# Patient Record
Sex: Male | Born: 1940 | Race: White | Hispanic: No | Marital: Married | Smoking: Never smoker
Health system: Southern US, Community
[De-identification: ages and names within clinical notes are randomized; demographics above are authoritative.]

## PROBLEM LIST (undated history)

## (undated) DIAGNOSIS — I10 Essential (primary) hypertension: Secondary | ICD-10-CM

## (undated) DIAGNOSIS — E785 Hyperlipidemia, unspecified: Secondary | ICD-10-CM

## (undated) HISTORY — PX: REPLACEMENT TOTAL KNEE: SUR1224

## (undated) HISTORY — PX: BACK SURGERY: SHX140

## (undated) HISTORY — PX: CHOLECYSTECTOMY: SHX55

## (undated) HISTORY — PX: SHOULDER SURGERY: SHX246

## (undated) HISTORY — PX: APPENDECTOMY: SHX54

---

## 2014-03-30 ENCOUNTER — Emergency Department (HOSPITAL_BASED_OUTPATIENT_CLINIC_OR_DEPARTMENT_OTHER)
Admission: EM | Admit: 2014-03-30 | Discharge: 2014-03-30 | Disposition: A | Discharge status: Home / Self Care | Payer: Medicare Other | Attending: Emergency Medicine | Admitting: Emergency Medicine

## 2014-03-30 ENCOUNTER — Ambulatory Visit (HOSPITAL_BASED_OUTPATIENT_CLINIC_OR_DEPARTMENT_OTHER)
Admission: RE | Admit: 2014-03-30 | Discharge: 2014-03-30 | Disposition: A | Discharge status: Home / Self Care | Payer: Medicare Other | Source: Ambulatory Visit | Attending: Emergency Medicine | Admitting: Emergency Medicine

## 2014-03-30 ENCOUNTER — Encounter (HOSPITAL_BASED_OUTPATIENT_CLINIC_OR_DEPARTMENT_OTHER): Payer: Self-pay | Admitting: Emergency Medicine

## 2014-03-30 ENCOUNTER — Emergency Department (HOSPITAL_BASED_OUTPATIENT_CLINIC_OR_DEPARTMENT_OTHER): Payer: Medicare Other

## 2014-03-30 ENCOUNTER — Other Ambulatory Visit (HOSPITAL_BASED_OUTPATIENT_CLINIC_OR_DEPARTMENT_OTHER): Payer: Self-pay | Admitting: *Deleted

## 2014-03-30 DIAGNOSIS — X58XXXA Exposure to other specified factors, initial encounter: Secondary | ICD-10-CM | POA: Diagnosis not present

## 2014-03-30 DIAGNOSIS — T148XXA Other injury of unspecified body region, initial encounter: Secondary | ICD-10-CM

## 2014-03-30 DIAGNOSIS — Y9389 Activity, other specified: Secondary | ICD-10-CM | POA: Insufficient documentation

## 2014-03-30 DIAGNOSIS — S023XXA Fracture of orbital floor, initial encounter for closed fracture: Secondary | ICD-10-CM

## 2014-03-30 DIAGNOSIS — S0230XA Fracture of orbital floor, unspecified side, initial encounter for closed fracture: Secondary | ICD-10-CM | POA: Insufficient documentation

## 2014-03-30 DIAGNOSIS — S46909A Unspecified injury of unspecified muscle, fascia and tendon at shoulder and upper arm level, unspecified arm, initial encounter: Secondary | ICD-10-CM | POA: Insufficient documentation

## 2014-03-30 DIAGNOSIS — J3489 Other specified disorders of nose and nasal sinuses: Secondary | ICD-10-CM | POA: Diagnosis present

## 2014-03-30 DIAGNOSIS — S62001A Unspecified fracture of navicular [scaphoid] bone of right wrist, initial encounter for closed fracture: Secondary | ICD-10-CM

## 2014-03-30 DIAGNOSIS — W010XXA Fall on same level from slipping, tripping and stumbling without subsequent striking against object, initial encounter: Secondary | ICD-10-CM | POA: Insufficient documentation

## 2014-03-30 DIAGNOSIS — S4980XA Other specified injuries of shoulder and upper arm, unspecified arm, initial encounter: Secondary | ICD-10-CM | POA: Insufficient documentation

## 2014-03-30 DIAGNOSIS — S62009A Unspecified fracture of navicular [scaphoid] bone of unspecified wrist, initial encounter for closed fracture: Secondary | ICD-10-CM | POA: Insufficient documentation

## 2014-03-30 DIAGNOSIS — I10 Essential (primary) hypertension: Secondary | ICD-10-CM | POA: Insufficient documentation

## 2014-03-30 DIAGNOSIS — S298XXA Other specified injuries of thorax, initial encounter: Secondary | ICD-10-CM | POA: Insufficient documentation

## 2014-03-30 DIAGNOSIS — Z79899 Other long term (current) drug therapy: Secondary | ICD-10-CM | POA: Insufficient documentation

## 2014-03-30 DIAGNOSIS — S02401A Maxillary fracture, unspecified, initial encounter for closed fracture: Secondary | ICD-10-CM | POA: Diagnosis present

## 2014-03-30 DIAGNOSIS — Y929 Unspecified place or not applicable: Secondary | ICD-10-CM | POA: Insufficient documentation

## 2014-03-30 DIAGNOSIS — S02400A Malar fracture unspecified, initial encounter for closed fracture: Secondary | ICD-10-CM | POA: Diagnosis present

## 2014-03-30 DIAGNOSIS — Z7982 Long term (current) use of aspirin: Secondary | ICD-10-CM | POA: Insufficient documentation

## 2014-03-30 HISTORY — DX: Essential (primary) hypertension: I10

## 2014-03-30 HISTORY — DX: Hyperlipidemia, unspecified: E78.5

## 2014-03-30 MED ORDER — HYDROCODONE-ACETAMINOPHEN 5-325 MG PO TABS: 2.0000 | ORAL_TABLET | ORAL | Status: DC | PRN

## 2014-03-30 MED ORDER — HYDROCODONE-ACETAMINOPHEN 5-325 MG PO TABS
1.0000 | ORAL_TABLET | Freq: Once | ORAL | Status: AC
Start: 2014-03-30 — End: 2014-03-30
  Administered 2014-03-30: 1 via ORAL
  Filled 2014-03-30: qty 1

## 2014-03-30 MED ORDER — CEPHALEXIN 500 MG PO CAPS: 500.0000 mg | ORAL_CAPSULE | Freq: Four times a day (QID) | ORAL | Status: DC

## 2014-03-30 NOTE — ED Notes (Signed)
Patient transported to X-ray 

## 2014-03-30 NOTE — ED Notes (Signed)
Patient here from radiology after having outpatient CT and needing further evaluation for maxillofacial fractures. Patient ambulatory, alert and oriented

## 2014-03-30 NOTE — Discharge Instructions (Signed)
Orbital Floor Fracture, Blowout Follow up with Dr. Verne SpurrLeinbach tomorrow at 1:45pm.  Take the antibiotics as prescribed. Sleep with head of bed elevated and do not blow your nose. Follow up with Dr. Gwen PoundsKowalski tomorrow as well. You also need to see Dr. Merlyn LotKuzma for your wrist fracture. Return to the ED if you develop new or worsening symptoms. The eye sits in the bony structure of the skull called the orbit. The upper and outside walls of the orbit are very thick and strong. These walls protect the eye if the head is struck from the top or side of the eye. However, the inside wall near the nose and the orbit floor are very thin and weak. The bony floor of the orbit also acts as the roof of the air-filled space (sinus) below the orbit. If the eye receives a direct blow from the front, all the tissues around the eye are briefly pressed together. This makes the orbital wall pressure very high. Since the weakest walls tend to give way first, the inside wall or the orbit floor may break. If the floor fractures, the tissues around the eye, including the muscle that is used to make the eye look down, may become trapped within the fracture as the floor of the orbit "blows out" into the sinus below.  CAUSES  Orbital floor fractures are caused by direct (blunt) trauma to the region of the eye. SYMPTOMS  Assuming that there has been no injury to the eye itself, symptoms can include:  Puffiness (swelling) and bruising around the eye area (black eye).  A gurgling sound when pressure is placed on the eye area. This sound comes from air that has escaped from the sinus into the space around the eye (orbital emphysema).  Seeing two of everything - one object being higher than the other (vertical diplopia). This is the result of the muscle that moves the eye down being trapped within the fracture. Since it cannot relax, the eye is being held in a downward position relative to the other eye and cannot look up. Vertical diplopia  from an orbital floor fracture is worse when looking up.  Pain around the eye when looking up.  One eye looks sunken compared to the other eye (enophthalmos).  Numbness of the cheek and upper gum on the same side of the face with the floor fracture. This is a result of nerve injury to these areas. This nerve runs in a groove along the bone of the orbital floor on its way to the cheek and upper gums. DIAGNOSIS  The diagnosis of an orbital floor fracture is suspected during an eye exam by an ophthalmologist. It is confirmed by X-rays or CT scan of the eye region. TREATMENT   Orbital floor fractures are not usually treated until all of the swelling around the eye has gone away. This may take 1 or 2 weeks. Once the swelling has gone down, an ophthalmologist will see if if the muscle below the eye is still trapped within the fracture.  If there is no sign of a trapped muscle or vertical diplopia, treatment is not necessary.  If there is double vision only when looking up, a decision may be made to not do anything since most people do not spend a lot of time looking up. This may depend on the person's profession. For instance, a Nutritional therapistplumber or electrician may spend a large part of their day looking up and would therefore need treatment.  If there is persistent vertical  double vision even when looking straight ahead, the ophthalmologist may try to free the muscle in the office. If this is unsuccessful, surgery is often needed. SEEK IMMEDIATE MEDICAL CARE IF:  You have had a blow to the region of your eyes and have:  A drop in vision in either eye.  Swelling and bruising around either eye.  One eye seems to be "sunken" compared to the other.  You see two of everything with both eyes open when looking in any direction.  The two images get further apart when looking in a certain direction - especially up.  You have numbness of the cheek and upper gums on the side of the injury.  You develop an  unexplained oral temperature over 102 F (38.9 C), or as your caregiver suggests. Document Released: 03/01/2001 Document Revised: 11/28/2011 Document Reviewed: 10/21/2011 [facility] Patient Information 2015 [place], [place]. This information is not intended to replace advice given to you by your health care provider. Make sure you discuss any questions you have with your health care provider.  Scaphoid Fracture, Wrist A fracture is a break in the bone. The bone you have broken often does not show up as a fracture on x-ray until later on in the healing phase. This bone is called the scaphoid bone. With this bone, your caregiver will often cast or splint your wrist as though it is fractured, even if a fracture is not seen on the x-ray. This is often done with wrist injuries in which there is tenderness at the base of the thumb. An x-ray at 1-3 weeks after your injury may confirm this fracture. A cast or splint is used to protect and keep your injured bone in good position for healing. The cast or splint will be on generally for about 6 to 16 weeks, depending on your health, age, the fracture location and how quickly you heal. Another name for the scaphoid bone is the navicular bone. HOME CARE INSTRUCTIONS   To lessen the swelling and pain, keep the injured part elevated above your heart while sitting or lying down.  Apply ice to the injury for 15-20 minutes, 03-04 times per day while awake, for 2 days. Put the ice in a plastic bag and place a thin towel between the bag of ice and your cast.  If you have a plaster or fiberglass cast or splint:  Do not try to scratch the skin under the cast using sharp or pointed objects.  Check the skin around the cast every day. You may put lotion on any red or sore areas.  Keep your cast or splint dry and clean.  If you have a plaster splint:  Wear the splint as directed.  You may loosen the elastic bandage around the splint if your fingers become numb, tingle,  or turn cold or blue.  If you have been put in a removable splint, wear and use as directed.  Do not put pressure on any part of your cast or splint; it may deform or break. Rest your cast or splint only on a pillow the first 24 hours until it is fully hardened.  Your cast or splint can be protected during bathing with a plastic bag. Do not lower the cast or splint into water.  Only take over-the-counter or prescription medicines for pain, discomfort, or fever as directed by your caregiver.  If your caregiver has given you a follow up appointment, it is very important to keep that appointment. Not keeping the appointment could  result in chronic pain and decreased function. If there is any problem keeping the appointment, you must call back to this facility for assistance. SEEK IMMEDIATE MEDICAL CARE IF:   Your cast gets damaged, wet or breaks.  You have continued severe pain or more swelling than you did before the cast or splint was put on.  Your skin or nails below the injury turn blue or gray, or feel cold or numb.  You have tingling or burning pain in your fingers or increasing pain with movement of your fingers Document Released: 08/26/2002 Document Revised: 11/28/2011 Document Reviewed: 04/24/2009 [facility] Patient Information 2015 [place], [place]. This information is not intended to replace advice given to you by your health care provider. Make sure you discuss any questions you have with your health care provider.

## 2014-03-30 NOTE — ED Notes (Signed)
Pt reports fall on Friday - states he was ambulating up an incline and slipped and tripped over his feet. Pt sent by Urgent Care for facial CT and based on Radiologist recommendation patient should be admitted for orbital fractures. Pt reports he fell onto face and right side of body. Pt c/o right arm pain, tenderness, edema - pt has splint in place that was placed by Urgent Care. Discoloration to right knee. Pt has facial discoloration with pain noted.

## 2014-03-30 NOTE — ED Notes (Signed)
I applied a thumb spica splint with fiberglass. I first applied sock material, then cotton webril, then fiberglass and held all with soft kerlix wrap. I completed with ace and secured with tape. Patient stated he felt much better, comfort improved.

## 2014-03-30 NOTE — ED Provider Notes (Signed)
CSN: [id number]     Arrival date & time 03/30/14  1851 History  This chart was scribed for Glynn Octave, MD by Modena Jansky, ED Scribe. This patient was seen in room MH04/MH04 and the patient's care was started at 6:59 PM.   Chief Complaint  Patient presents with  . Fall   HPI HPI Comments: Randale Carvalho is a 73 y.o. male who presents to the Emergency Department complaining of a fall that occurred two nights ago. He states that he tripped over his own feet and fell against concrete. He states his face, right wrist, rib, and right shoulder hurts. He states that he went to [facility] and they did x rays but was advised to come to the ED after CT scan showed orbital fracture. He denies any LOC, dizziness, chest pain, cough, headache, or visual disturbance. Tetanus up to date  PCP- Dr. Evonnie Dawes in [place] Past Medical History  Diagnosis Date  . Hypertension   . Hyperlipidemia    Past Surgical History  Procedure Laterality Date  . Shoulder surgery    . Back surgery    . Replacement total knee Bilateral   . Appendectomy    . Cholecystectomy     History reviewed. No pertinent family history. History  Substance Use Topics  . Smoking status: Never Smoker   . Smokeless tobacco: Not on file  . Alcohol Use: No    Review of Systems  A complete 10 system review of systems was obtained and all systems are negative except as noted in the HPI and PMH.   Allergies  Review of patient's allergies indicates no known allergies.  Home Medications   Prior to Admission medications   Medication Sig Start Date End Date Taking? Authorizing Provider  AMLODIPINE BESYLATE PO Take by mouth 2 (two) times daily.   Yes Historical Provider, MD  aspirin 81 MG tablet Take 81 mg by mouth daily.   Yes Historical Provider, MD  dextromethorphan-guaiFENesin (ROBITUSSIN-DM) 10-100 MG/5ML liquid Take by mouth every 4 (four) hours as needed for cough.   Yes Historical Provider, MD  FENOFIBRATE MICRONIZED PO  Take by mouth.   Yes Historical Provider, MD  LISINOPRIL PO Take by mouth.   Yes Historical Provider, MD  Multiple Vitamin (MULTIVITAMIN) capsule Take 1 capsule by mouth daily.   Yes Historical Provider, MD  pantoprazole (PROTONIX) 40 MG tablet Take 40 mg by mouth at bedtime.   Yes Historical Provider, MD  PAROXETINE HCL PO Take by mouth.   Yes Historical Provider, MD  TERAZOSIN HCL PO Take by mouth.   Yes Historical Provider, MD  cephALEXin (KEFLEX) 500 MG capsule Take 1 capsule (500 mg total) by mouth 4 (four) times daily. 03/30/14   Glynn Octave, MD  HYDROcodone-acetaminophen (NORCO/VICODIN) 5-325 MG per tablet Take 2 tablets by mouth every 4 (four) hours as needed. 03/30/14   Glynn Octave, MD   BP 172/86  Pulse 78  Temp(Src) 98.1 F (36.7 C) (Oral)  Resp 18  Ht 5\' 9"  (1.753 m)  Wt 284 lb (128.822 kg)  BMI 41.92 kg/m2  SpO2 94% Physical Exam  Nursing note and vitals reviewed. Constitutional: He is oriented to person, place, and time. He appears well-developed and well-nourished. No distress.  HENT:  Head: Normocephalic and atraumatic.  Mouth/Throat: Oropharynx is clear and moist. No oropharyngeal exudate.  Subconjunctival hemorrhage on right eye with purulent discharge in conjunctiva. R maxillary sinus tenderness Teeth are normal with no malocclusion.   Eyes: EOM are normal. Pupils are  equal, round, and reactive to light. Left eye exhibits discharge. Left eye exhibits no chemosis. Left conjunctiva is injected. Left conjunctiva has a hemorrhage.  Slit lamp exam:      The left eye shows no hyphema and no hypopyon.  Periorbital ecchymosis on R. EOMI  And full  Neck: Normal range of motion. Neck supple.  No C spine tenderness  Cardiovascular: Normal rate, regular rhythm, normal heart sounds and intact distal pulses.   No murmur heard. Pulmonary/Chest: Effort normal and breath sounds normal. No respiratory distress.  Abdominal: Soft. There is no tenderness. There is no rebound  and no guarding.  Musculoskeletal: Normal range of motion. He exhibits tenderness. He exhibits no edema.  No c-spine tenderness. Periorbital ecchymosis on R.  Tenderness to right wrist no crepitance. +snuffbox tenderness. +2 radial pulse. Pain with normal range of motion. Cardinal movements intact.   FROM R elbow and shoulder Abrasion R knee  Neurological: He is alert and oriented to person, place, and time. No cranial nerve deficit. He exhibits normal muscle tone. Coordination normal.  No ataxia on finger to nose bilaterally. No pronator drift. 5/5 strength throughout. CN 2-12 intact. Negative Romberg. Equal grip strength. Sensation intact. Gait is normal.   Skin: Skin is warm. There is erythema.  Psychiatric: He has a normal mood and affect. His behavior is normal.    ED Course  Procedures (including critical care time) DIAGNOSTIC STUDIES: Oxygen Saturation is 94% on RA, normal by my interpretation.    COORDINATION OF CARE: 7:03 PM- Pt advised of plan for treatment which includes radiology and pt agrees.  Labs Review Labs Reviewed - No data to display  Imaging Review Dg Wrist Complete Right  03/30/2014   CLINICAL DATA:  Pain post trauma  EXAM: RIGHT WRIST - COMPLETE 3+ VIEW  COMPARISON:  None.  FINDINGS: Frontal, oblique, lateral, and ulnar deviation scaphoid images were obtained. There is a subtle lucency in the distal scaphoid concerning for of fracture in this area. No other fracture. No dislocation. 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 Calcifications are noted within the radiocarpal joint.  IMPRESSION: Findings concerning for nondisplaced fracture of the distal scaphoid, best appreciated on the frontal view. Multifocal osteoarthritic change. No dislocation.   Electronically Signed   By: Bretta Bang M.D.   On: 03/30/2014 19:41   Ct Head Wo Contrast  03/30/2014   CLINICAL DATA:  Status post fall with a blow to the face.  EXAM: CT HEAD WITHOUT CONTRAST  TECHNIQUE:  Contiguous axial images were obtained from the base of the skull through the vertex without intravenous contrast.  COMPARISON:  Maxillofacial CT scan earlier this same day.  FINDINGS: There is some cortical atrophy and chronic microvascular ischemic change. No evidence of acute abnormality including infarct, hemorrhage, mass lesion, mass effect, midline shift or abnormal extra-axial fluid collection is identified. No pneumocephalus or hydrocephalus is present. The calvarium is intact. Fracture of the inferior wall the right orbit is again seen as on the study earlier today.  IMPRESSION: No acute finding.  Fracture inferior wall right orbit as seen on maxillofacial CT scan earlier today.   Electronically Signed   By: Drusilla Kanner M.D.   On: 03/30/2014 19:42   Ct Maxillofacial Wo Cm  03/30/2014   CLINICAL DATA:  Pain post trauma  EXAM: CT MAXILLOFACIAL WITHOUT CONTRAST  TECHNIQUE: Multidetector CT imaging of the maxillofacial structures was performed. Multiplanar CT image reconstructions were also generated. A small metallic BB was placed on the right temple  in order to reliably differentiate right from left.  COMPARISON:  None.  FINDINGS: There is a fracture of the right orbital floor with fat extending into the superior aspect of the right maxillary antrum. There appears to be entrapment of the inferior oblique muscle in this area. There is a fracture of the right maxillary antrum with mild medial bowing in this area. There is soft tissue swelling over the mid right face.  No other fractures are appreciable. No dislocation. There is a retention cyst in the inferomedial left maxillary antrum. There is mucosal thickening in the right maxillary antrum. There is mucosal thickening in several ethmoid sinuses bilaterally. There is opacification of a posterior ethmoid air cell. Other paranasal sinuses are clear.  There is mild leftward deviation of the anterior fibrous nasal septum.  Ostiomeatal unit complexes  appear patent bilaterally.  Orbits appear symmetric and normal bilaterally except for the apparent entrapment of the right inferior oblique muscle. Visualized brain parenchyma appears normal except for mild generalized atrophy. Mastoid air cells clear. Visualized upper neck structures appear unremarkable.  IMPRESSION: Fracture of the right orbital floor with fat extending into the superior right maxillary antrum. There appears to be some entrapment of the right inferior oblique muscle. There is also a fracture of the right maxillary antrum.  No other fractures are appreciable. There are areas of paranasal sinus disease. Study otherwise unremarkable except for mild generalized cerebral atrophy.  These results were called by telephone at the time of interpretation on 03/30/2014 at 6:46 PM to Dr. Laurey Arrow who verbally acknowledged these results. Dr. Manus Gunning is the covering physician at the [facility] facility; he agreed to see the patient for the apparent right inferior oblique ocular entrapment and make appropriate specialty referral as needed.   Electronically Signed   By: Bretta Bang M.D.   On: 03/30/2014 18:47     EKG Interpretation None      MDM   Final diagnoses:  Fracture of orbital floor, closed, initial encounter  Scaphoid fracture of wrist, right, closed, initial encounter   Patient had outpatient CT scan which showed right orbital floor fracture with concern for entrapment of right inferior oblique muscle. He had a mechanical fall 2 nights ago and was seen at urgent care. Denies losing consciousness. Complains of right shoulder, right wrist and right rib pain. Denies any shortness of breath. Plain films at urgent care were negative. EOMI intact. No blurry vision or double vision. Visual acuity OD 20/50, OS 20/30.  X-ray from urgent care shows no rib fracture or pneumothorax. Right wrist shows soft tissue swelling. right forearm was negative, right shoulder  was negative.  Case discussed with [place] [facility] physician Vira Blanco.  he agrees patient does not appear to be clinically entrapped.   he agrees with sinus precautions, antibiotics. He will see patient in the office at 1:45 PM tomorrow.  Visual acuity is normal. D/w Dr. Gwen Pounds, ophthomology.  He agrees clinically patient is not entrapped and no indication for emergent evaluation tonight.  He wishes to see in office this week. He agrees with antibiotics and sinus precautions.   X-ray shows probable scaphoid fracture. Patient we placed in spica splint and will be given hand followup as well.  BP 171/80  Pulse 69  Temp(Src) 98.7 F (37.1 C) (Oral)  Resp 18  Ht 5\' 9"  (1.753 m)  Wt 284 lb (128.822 kg)  BMI 41.92 kg/m2  SpO2 92%  I personally performed the services described  in this documentation, which was scribed in my presence. The recorded information has been reviewed and is accurate.     Glynn OctaveStephen Jakin Pavao, MD 03/31/14 [id number]

## 2014-12-15 IMAGING — CR DG WRIST COMPLETE 3+V*R*
4 series · 4 of 4 positions shown · non-contrast
Comparison: None.

CLINICAL DATA: Pain post trauma

EXAM:
RIGHT WRIST - COMPLETE 3+ VIEW

[x wrist pa right]
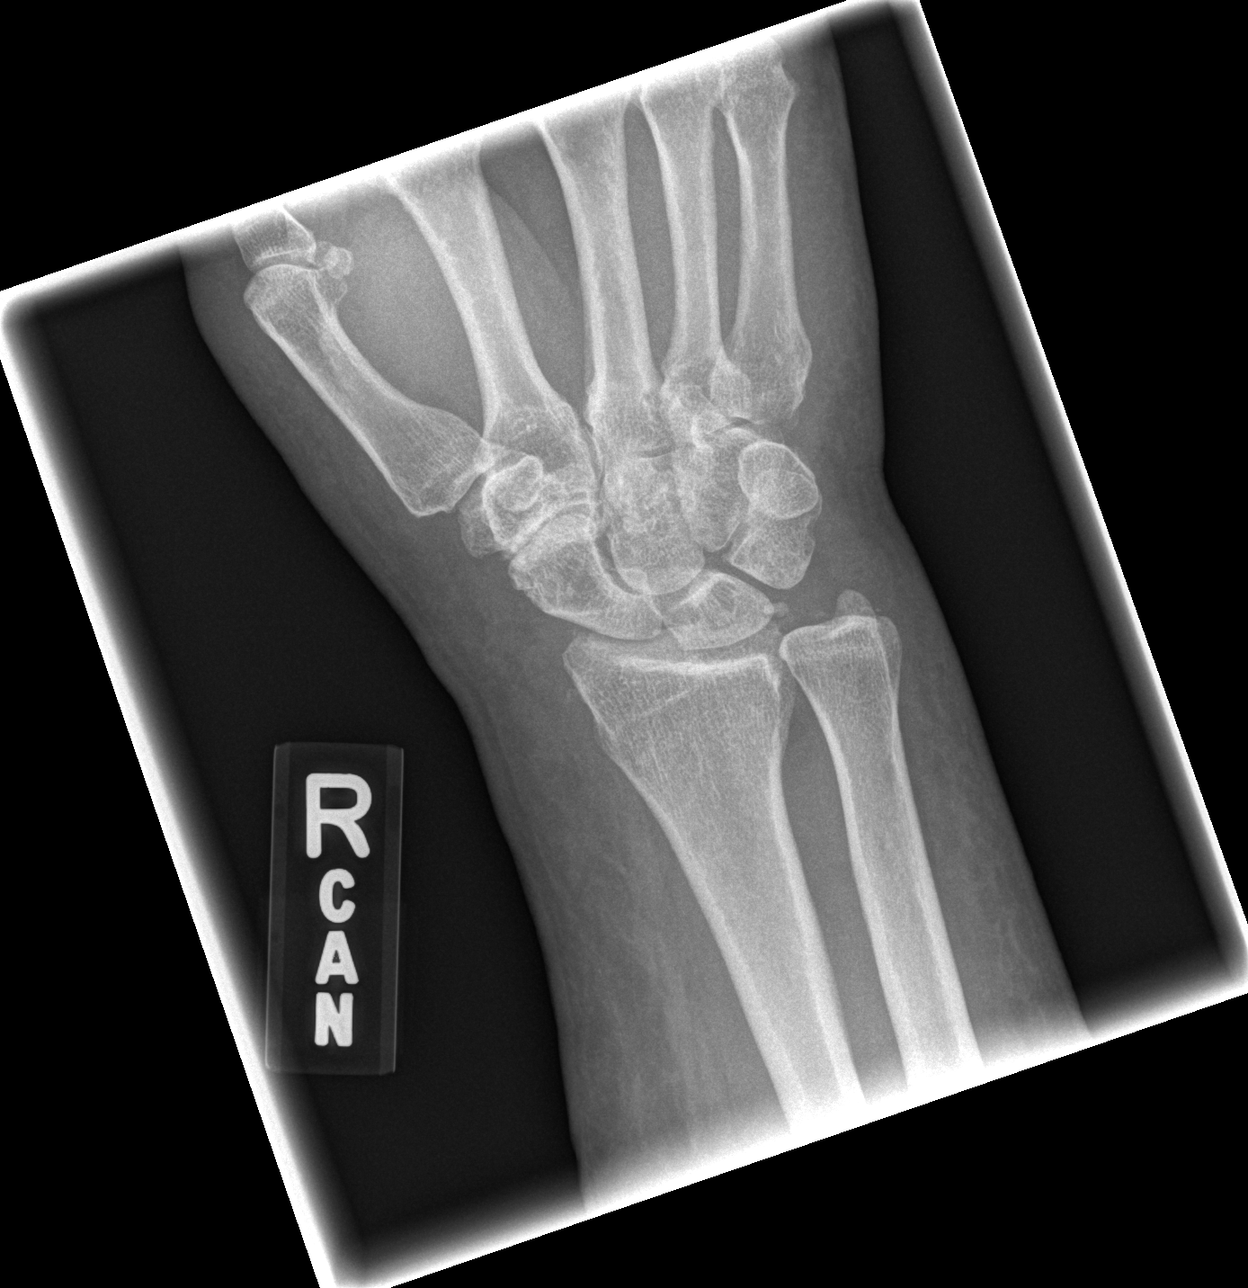

[x wrist obl right]
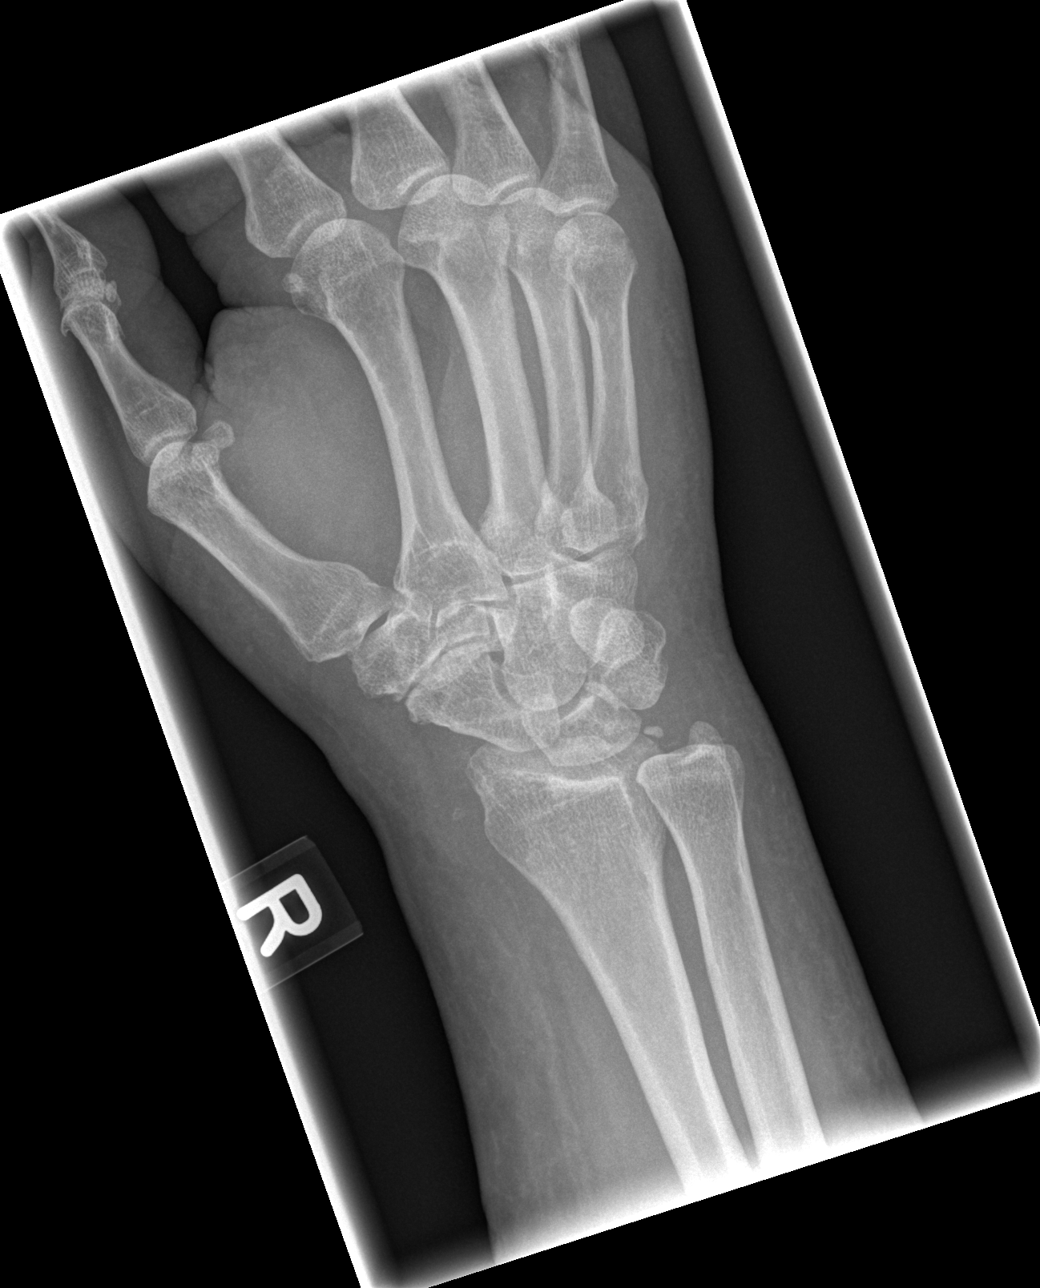

[x wrist lat right]
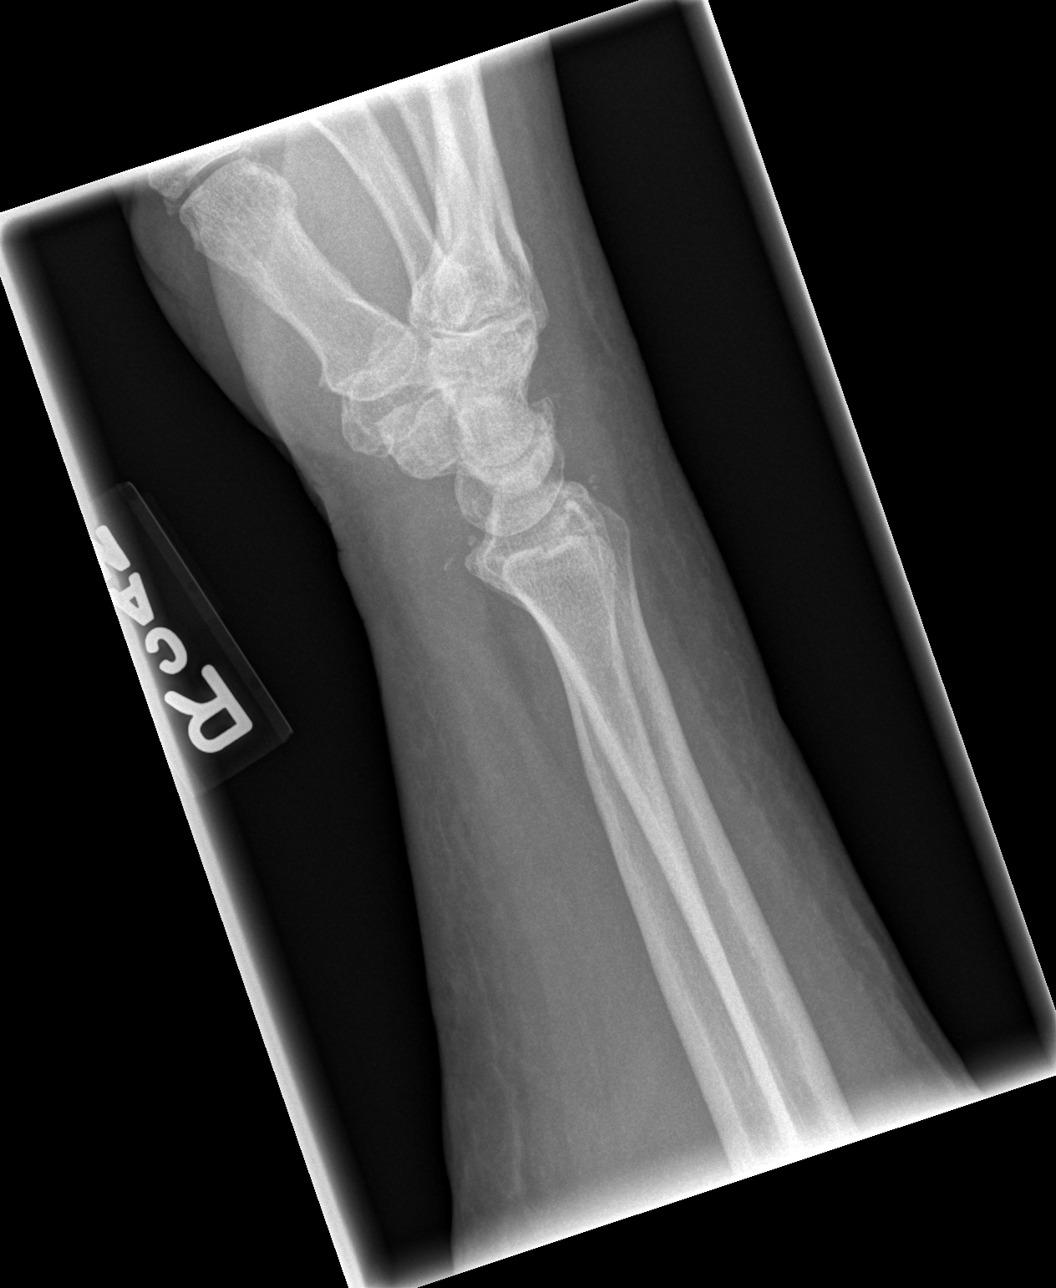

[x navicular]
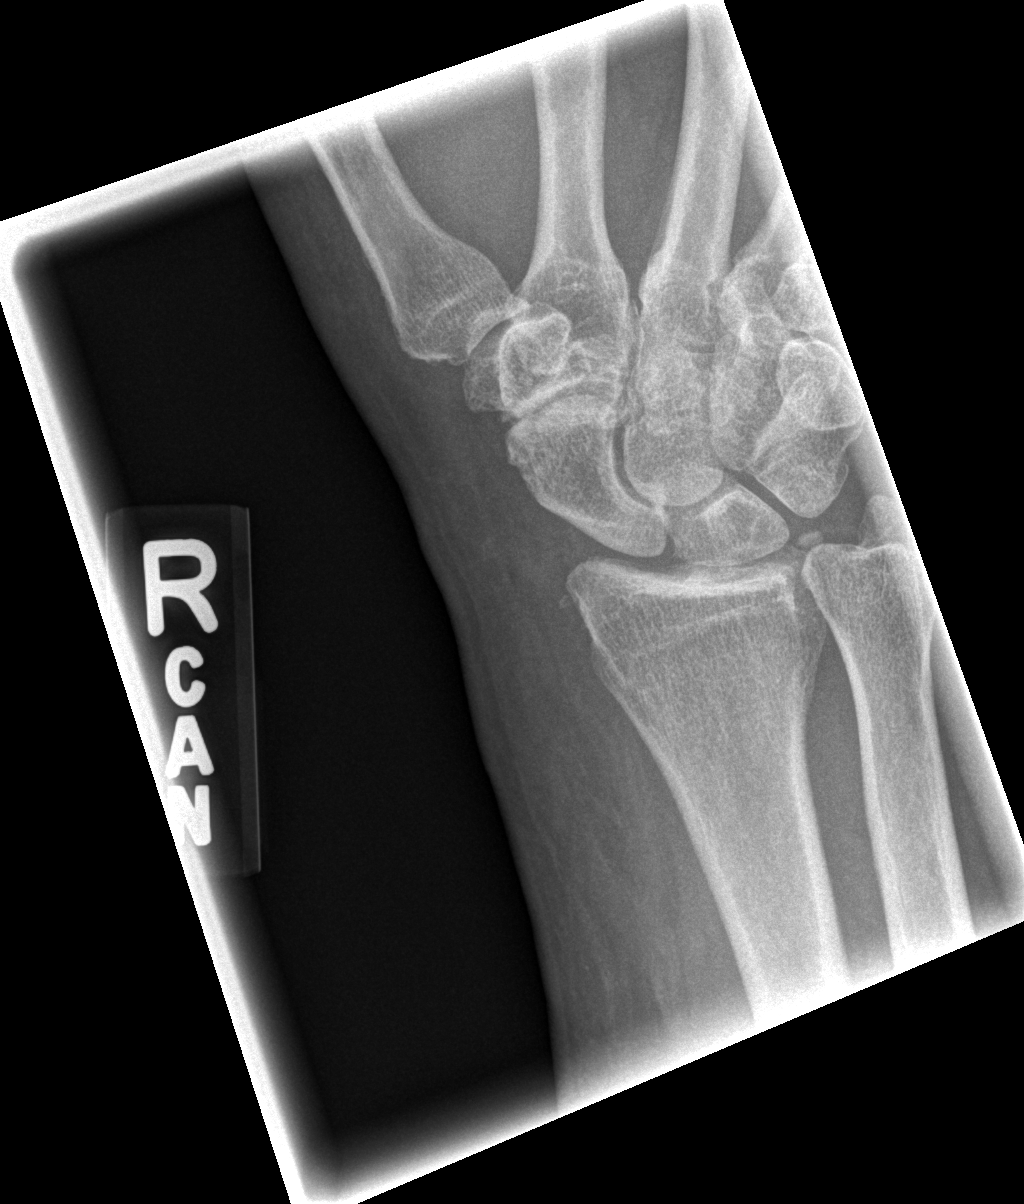

[4 of 4 positions shown; findings below may reference images not displayed]

FINDINGS: Frontal, oblique, lateral, and ulnar deviation scaphoid images were
obtained. There is a subtle lucency in the distal scaphoid
concerning for of fracture in this area. No other fracture. No
dislocation. There is moderate generalized osteoarthritic change.
Calcifications are noted within the radiocarpal joint.
IMPRESSION: Findings concerning for nondisplaced fracture of the distal
scaphoid, best appreciated on the frontal view. Multifocal
osteoarthritic change. No dislocation.

## 2015-10-08 DIAGNOSIS — N400 Enlarged prostate without lower urinary tract symptoms: Secondary | ICD-10-CM | POA: Insufficient documentation

## 2015-10-08 DIAGNOSIS — I10 Essential (primary) hypertension: Secondary | ICD-10-CM | POA: Insufficient documentation

## 2015-11-12 DIAGNOSIS — K219 Gastro-esophageal reflux disease without esophagitis: Secondary | ICD-10-CM | POA: Insufficient documentation

## 2015-11-13 DIAGNOSIS — G4733 Obstructive sleep apnea (adult) (pediatric): Secondary | ICD-10-CM | POA: Insufficient documentation

## 2016-03-21 DIAGNOSIS — Z8679 Personal history of other diseases of the circulatory system: Secondary | ICD-10-CM | POA: Insufficient documentation

## 2017-02-21 DIAGNOSIS — H16223 Keratoconjunctivitis sicca, not specified as Sjogren's, bilateral: Secondary | ICD-10-CM | POA: Insufficient documentation

## 2017-02-21 DIAGNOSIS — H353131 Nonexudative age-related macular degeneration, bilateral, early dry stage: Secondary | ICD-10-CM | POA: Insufficient documentation

## 2017-04-17 DIAGNOSIS — G250 Essential tremor: Secondary | ICD-10-CM | POA: Insufficient documentation

## 2017-11-17 DIAGNOSIS — E291 Testicular hypofunction: Secondary | ICD-10-CM | POA: Insufficient documentation

## 2019-03-29 DIAGNOSIS — J984 Other disorders of lung: Secondary | ICD-10-CM | POA: Insufficient documentation

## 2019-10-21 DIAGNOSIS — E559 Vitamin D deficiency, unspecified: Secondary | ICD-10-CM | POA: Insufficient documentation

## 2019-11-06 DIAGNOSIS — E1122 Type 2 diabetes mellitus with diabetic chronic kidney disease: Secondary | ICD-10-CM | POA: Insufficient documentation

## 2019-11-06 DIAGNOSIS — I272 Pulmonary hypertension, unspecified: Secondary | ICD-10-CM | POA: Insufficient documentation

## 2021-07-31 ENCOUNTER — Other Ambulatory Visit: Payer: Self-pay | Admitting: Internal Medicine

## 2021-09-07 ENCOUNTER — Other Ambulatory Visit: Payer: Self-pay | Admitting: Internal Medicine

## 2021-12-10 DIAGNOSIS — M150 Primary generalized (osteo)arthritis: Secondary | ICD-10-CM | POA: Insufficient documentation

## 2022-04-21 DIAGNOSIS — F5101 Primary insomnia: Secondary | ICD-10-CM | POA: Insufficient documentation

## 2022-09-08 DIAGNOSIS — I4891 Unspecified atrial fibrillation: Secondary | ICD-10-CM | POA: Insufficient documentation

## 2022-09-08 DIAGNOSIS — N183 Chronic kidney disease, stage 3 unspecified: Secondary | ICD-10-CM | POA: Insufficient documentation

## 2022-09-08 DIAGNOSIS — I480 Paroxysmal atrial fibrillation: Secondary | ICD-10-CM | POA: Insufficient documentation

## 2022-09-15 DIAGNOSIS — D500 Iron deficiency anemia secondary to blood loss (chronic): Secondary | ICD-10-CM | POA: Insufficient documentation

## 2022-11-29 DIAGNOSIS — I5030 Unspecified diastolic (congestive) heart failure: Secondary | ICD-10-CM | POA: Insufficient documentation

## 2023-12-25 DIAGNOSIS — Z7409 Other reduced mobility: Secondary | ICD-10-CM | POA: Insufficient documentation

## 2024-06-27 DIAGNOSIS — I350 Nonrheumatic aortic (valve) stenosis: Secondary | ICD-10-CM | POA: Insufficient documentation

## 2024-07-30 ENCOUNTER — Ambulatory Visit: Admitting: Family Medicine

## 2024-07-30 ENCOUNTER — Encounter: Payer: Self-pay | Admitting: Family Medicine

## 2024-07-30 VITALS — BP 178/102 | HR 82 | Temp 97.0°F | Resp 18 | Ht 70.5 in | Wt 273.0 lb

## 2024-07-30 DIAGNOSIS — F5101 Primary insomnia: Secondary | ICD-10-CM

## 2024-07-30 DIAGNOSIS — Z7689 Persons encountering health services in other specified circumstances: Secondary | ICD-10-CM

## 2024-07-30 DIAGNOSIS — R052 Subacute cough: Secondary | ICD-10-CM | POA: Diagnosis not present

## 2024-07-30 DIAGNOSIS — G4733 Obstructive sleep apnea (adult) (pediatric): Secondary | ICD-10-CM

## 2024-07-30 DIAGNOSIS — I5032 Chronic diastolic (congestive) heart failure: Secondary | ICD-10-CM

## 2024-07-30 DIAGNOSIS — E66812 Obesity, class 2: Secondary | ICD-10-CM

## 2024-07-30 DIAGNOSIS — I480 Paroxysmal atrial fibrillation: Secondary | ICD-10-CM | POA: Diagnosis not present

## 2024-07-30 DIAGNOSIS — N400 Enlarged prostate without lower urinary tract symptoms: Secondary | ICD-10-CM

## 2024-07-30 DIAGNOSIS — E1122 Type 2 diabetes mellitus with diabetic chronic kidney disease: Secondary | ICD-10-CM | POA: Diagnosis not present

## 2024-07-30 DIAGNOSIS — I10 Essential (primary) hypertension: Secondary | ICD-10-CM

## 2024-07-30 DIAGNOSIS — Z6838 Body mass index (BMI) 38.0-38.9, adult: Secondary | ICD-10-CM

## 2024-07-30 DIAGNOSIS — G250 Essential tremor: Secondary | ICD-10-CM

## 2024-07-30 DIAGNOSIS — N1831 Chronic kidney disease, stage 3a: Secondary | ICD-10-CM

## 2024-07-30 DIAGNOSIS — Z7409 Other reduced mobility: Secondary | ICD-10-CM

## 2024-07-30 DIAGNOSIS — E782 Mixed hyperlipidemia: Secondary | ICD-10-CM

## 2024-07-30 MED ORDER — DM-GUAIFENESIN ER 30-600 MG PO TB12: 1.0000 | ORAL_TABLET | Freq: Two times a day (BID) | ORAL | Status: DC

## 2024-07-30 NOTE — Progress Notes (Signed)
 Assessment  Assessment/Plan:  Assessment and Plan Assessment & Plan Atrial fibrillation Managed by cardiology. Watchman device in place, no current anticoagulation. Scheduled for cardioversion on November 17th, 2025. Previous cardioversions were unsuccessful. - Proceed with scheduled cardioversion on November 17th, 2025.  Heart failure with preserved ejection fraction Managed with blood pressure control and Bumex 1 mg three times a week. Cardiologist involved in management. - Continue current heart failure management regimen.  Hypertension Consistently elevated blood pressure despite compliance with amlodipine, Cardizem, losartan, and hydralazine. Blood pressure was 200/100 mmHg during recent travel. - Continue current antihypertensive regimen.  Chronic kidney disease Noted with some kidney changes over time.  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 Engages in regular physical activity but struggles with weight loss. Discussed potential use of weight loss medications, considering cost and insurance implications. Previous hemoglobin A1c indicated diabetic range, which may aid in medication cost reduction. - Continue physical activity and healthy lifestyle modifications. - Will consider weight loss medications post-cardioversion, considering cost and insurance implications.  Obstructive sleep apnea Managed with CPAP, but compliance is intermittent.  Insomnia Managed with trazodone, which is effective. - Continue trazodone for insomnia management.  Hyperlipidemia Reports balance issues potentially related to Lipitor use. - Continue Lipitor for hyperlipidemia management.  Chronic mucus production Difficulty expectorating. No prior use of Mucinex. - Recommended over-the-counter Mucinex (guaifenesin) for mucus thinning.  General Health Maintenance Thyroid function previously checked and normal. No current thyroid issues reported. - Continue routine health  maintenance.      Medications Discontinued During This Encounter  Medication Reason   AMLODIPINE BESYLATE PO    dextromethorphan-guaiFENesin (ROBITUSSIN-DM) 10-100 MG/5ML liquid    FENOFIBRATE MICRONIZED PO    LISINOPRIL PO    HYDROcodone -acetaminophen  (NORCO/VICODIN) 5-325 MG per tablet    cephALEXin  (KEFLEX ) 500 MG capsule    PAROXETINE HCL PO    TERAZOSIN HCL PO     Patient Counseling(The following topics were reviewed and/or handout was given):  -Nutrition: Stressed importance of moderation in sodium/caffeine intake, saturated fat and cholesterol, caloric balance, sufficient intake of fresh fruits, vegetables, and fiber.  -Stressed the importance of regular exercise.   -Substance Abuse: Discussed cessation/primary prevention of tobacco, alcohol, or other drug use; driving or other dangerous activities under the influence; availability of treatment for abuse.   -Injury prevention: Discussed safety belts, safety helmets, smoke detector, smoking near bedding or upholstery.   -Sexuality: Discussed sexually transmitted diseases, partner selection, use of condoms, avoidance of unintended pregnancy and contraceptive alternatives.   -Dental health: Discussed importance of regular tooth brushing, flossing, and dental visits.  -Health maintenance and immunizations reviewed. Please refer to Health maintenance section.  Return in about 6 weeks (around 09/10/2024) for weight management, Blood pressure, blood sugar.        Subjective:   Encounter date: 07/30/2024  Chief Complaint  Patient presents with   Establish Care    Pt is not fasting today   HM due- vaccinations    Hypertension    Pt states he does monitor blood pressure at home but is unsure of numbers    Discussed the use of AI scribe software for clinical note transcription with the patient, who gave verbal consent to proceed.  History of Present Illness Gabriel Higgins is an 83 year old male with atrial fibrillation  and heart failure who presents to establish care.  Atrial fibrillation and cardiac rhythm management - Atrial fibrillation with five previous unsuccessful cardioversions - Scheduled for repeat cardioversion on August 05, 2024 -  Followed by cardiology, last seen earlier this week - Presence of Watchman device - Not taking anticoagulation therapy  Heart failure with preserved ejection fraction - Heart failure with preserved ejection fraction - Bumex 1 mg taken three times a week - No current symptoms of decompensation reported  Hypertension - Consistently elevated blood pressure - Adherent to amlodipine, Cardizem, losartan, and hydralazine  Obesity and weight management - Class II obesity, BMI 38.6 - Weight loss from 310 pounds to 273 pounds - Engaged in physical exercise, including biking and leg exercises - Actively attempting further weight reduction  Obstructive sleep apnea - Obstructive sleep apnea managed with CPAP - Intermittent compliance with CPAP therapy  Insomnia - Insomnia managed with trazodone - Trazodone effective in improving sleep  Balance disturbance - Balance difficulty attributed to Lipitor, started several months ago - Uses a walker for ambulation - Actively working to improve balance and reduce walker dependence  Respiratory symptoms - Difficulty expectorating phlegm - Occasional production of 'brown junk' when brushing teeth or using mouthwash      07/30/2024    2:23 PM  Depression screen PHQ 2/9  Decreased Interest 0  Down, Depressed, Hopeless 0  PHQ - 2 Score 0  Altered sleeping 0  Tired, decreased energy 0  Change in appetite 0  Feeling bad or failure about yourself  0  Trouble concentrating 0  Moving slowly or fidgety/restless 0  Suicidal thoughts 0  PHQ-9 Score 0  Difficult doing work/chores Not difficult at all       07/30/2024    2:24 PM  GAD 7 : Generalized Anxiety Score  Nervous, Anxious, on Edge 0  Control/stop worrying  0  Worry too much - different things 0  Trouble relaxing 0  Restless 0  Easily annoyed or irritable 0  Afraid - awful might happen 0  Total GAD 7 Score 0  Anxiety Difficulty Not difficult at all    Health Maintenance Due  Topic Date Due   HEMOGLOBIN A1C  Never done   FOOT EXAM  Never done   Diabetic kidney evaluation - eGFR measurement  Never done   Diabetic kidney evaluation - Urine ACR  Never done   Medicare Annual Wellness (AWV)  12/14/2022      PMH:  The following were reviewed and entered/updated in epic: Past Medical History:  Diagnosis Date   Hyperlipidemia    Hypertension     Patient Active Problem List   Diagnosis Date Noted   Class 2 severe obesity due to excess calories with serious comorbidity and body mass index (BMI) of 38.0 to 38.9 in adult 07/30/2024   Subacute cough 07/30/2024   Mixed hyperlipidemia 07/30/2024   Aortic valve stenosis 06/27/2024   Impaired functional mobility, balance, gait, and endurance 12/25/2023   (HFpEF) heart failure with preserved ejection fraction (HCC) 11/29/2022   Iron deficiency anemia due to chronic blood loss 09/15/2022   Atrial fibrillation (HCC) 09/08/2022   Chronic kidney disease (CKD), stage III (moderate) (HCC) 09/08/2022   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   Diabetes mellitus with stage 3 chronic kidney disease, without long-term current use of insulin (HCC) 11/06/2019   Pulmonary hypertension (HCC) 11/06/2019   Hyperphosphatemia 10/21/2019   Vitamin D deficiency 10/21/2019   Restrictive lung disease 03/29/2019   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   Benign essential tremor 04/17/2017   Early stage nonexudative age-related macular degeneration of both eyes 02/21/2017   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 No pertinent family history.  Medications- reviewed and updated Outpatient Medications Prior to Visit  Medication Sig Dispense Refill   amiodarone (PACERONE) 200 MG tablet Take 200 mg by mouth daily.     aspirin 81 MG tablet Take 81 mg by mouth daily.     bumetanide (BUMEX) 1 MG tablet Take 1 mg by mouth.     cyanocobalamin 100 MCG tablet Take 100 mcg by mouth.     diltiazem (CARDIZEM CD) 180 MG 24 hr capsule Take 180 mg by mouth.     hydrALAZINE (APRESOLINE) 100 MG tablet Take 100 mg by mouth 3 (three) times daily.     losartan (COZAAR) 100 MG tablet Take 100 mg by mouth daily.     Multiple Vitamin (MULTIVITAMIN) capsule Take 1 capsule by mouth daily.     propranolol (INDERAL) 10 MG tablet TAKE 2 TABLETS (20 MG TOTAL) BY MOUTH 3 TIMES DAILY.     traZODone (DESYREL) 50 MG tablet Take 50 mg by mouth at bedtime.     pantoprazole (PROTONIX) 40 MG tablet Take 40 mg by mouth at bedtime. (Patient not taking: Reported on 07/30/2024)     AMLODIPINE BESYLATE PO Take by mouth 2 (two) times daily. (Patient not taking: Reported on 07/30/2024)     cephALEXin  (KEFLEX ) 500 MG capsule Take 1 capsule (500 mg total) by mouth 4 (four) times daily. (Patient not taking: Reported on 07/30/2024) 40 capsule 0   dextromethorphan-guaiFENesin (ROBITUSSIN-DM) 10-100 MG/5ML liquid Take by mouth every 4 (four) hours as needed for cough. (Patient not taking: Reported on 07/30/2024)     FENOFIBRATE MICRONIZED PO Take by mouth. (Patient not taking: Reported on 07/30/2024)     HYDROcodone -acetaminophen  (NORCO/VICODIN) 5-325 MG per tablet Take 2 tablets by mouth every 4 (four) hours as needed. (Patient not taking:  Reported on 07/30/2024) 10 tablet 0   LISINOPRIL PO Take by mouth. (Patient not taking: Reported on 07/30/2024)     PAROXETINE HCL PO Take by mouth. (Patient not taking: Reported on 07/30/2024)     TERAZOSIN HCL PO Take by mouth. (Patient not taking: Reported on 07/30/2024)     No facility-administered medications prior to visit.     Allergies  Allergen Reactions   Apixaban Other (See Comments)    Increased bleeding  Rectum bleeding  Rectum bleeding    Increased bleeding   Bee Venom Anaphylaxis   Honey Bee Venom Anaphylaxis   Mirabegron Anaphylaxis, Hypertension, Other (See Comments), Palpitations and Photosensitivity    Atrial Fibrillation  Hypertension  Hypertension  Atrial Fibrillation  Other Reaction(s): Hypertension    Hypertension Atrial Fibrillation    Atrial Fibrillation   Lisinopril Cough   Atorvastatin Other (See Comments)    Social History   Socioeconomic History   Marital status: Married    Spouse name: Not on file   Number of children: Not on file   Years of education: Not on file   Highest education level: Not on file  Occupational History   Not on file  Tobacco Use   Smoking  status: Never   Smokeless tobacco: Not on file  Vaping Use   Vaping status: Never Used  Substance and Sexual Activity   Alcohol use: No   Drug use: No   Sexual activity: Not Currently    Birth control/protection: None  Other Topics Concern   Not on file  Social History Narrative   Not on file   Social Drivers of Health   Financial Resource Strain: Not on file  Food Insecurity: Low Risk  (07/26/2024)   Received from [facility]   Hunger Vital Sign    Within the past 12 months, you worried that your food would run out before you got money to buy more: Never true    Within the past 12 months, the food you bought just didn't last and you didn't have money to get more. : Never true  Transportation Needs: No Transportation Needs (02/19/2023)   Received from [employer]    In the past 12 months, has lack of reliable transportation kept you from medical appointments, meetings, work or from getting things needed for daily living? : No  Physical Activity: Not on file  Stress: Not on file  Social Connections: Unknown (02/01/2022)   Received from [facility]   Social Network    Social Network: Not on file           Objective:  Physical Exam: BP (!) 178/102 (BP Location: Right Arm, Patient Position: Sitting, Cuff Size: Large) Comment: recheck  Pulse 82   Temp (!) 97 F (36.1 C) (Temporal)   Resp 18   Ht 5' 10.5 (1.791 m)   Wt 273 lb (123.8 kg)   SpO2 98%   BMI 38.62 kg/m   Body mass index is 38.62 kg/m. Wt Readings from Last 3 Encounters:  07/30/24 273 lb (123.8 kg)  03/30/14 284 lb (128.8 kg)    Physical Exam          Physical Exam Constitutional:      Appearance: Normal appearance.  HENT:     Head: Normocephalic and atraumatic.     Right Ear: Hearing normal.     Left Ear: Hearing normal.     Nose: Nose normal.  Eyes:     General: No scleral icterus.       Right eye: No discharge.        Left eye: No discharge.     Extraocular Movements: Extraocular movements intact.  Cardiovascular:     Rate and Rhythm: Normal rate and regular rhythm.     Heart sounds: Normal heart sounds.  Pulmonary:     Effort: Pulmonary effort is normal.     Breath sounds: Normal breath sounds.  Abdominal:     Palpations: Abdomen is soft.     Tenderness: There is no abdominal tenderness.  Skin:    General: Skin is warm.     Findings: No rash.  Neurological:     General: No focal deficit present.     Mental Status: He is alert.     Cranial Nerves: No cranial nerve deficit.  Psychiatric:        Mood and Affect: Mood normal.        Behavior: Behavior normal.        Thought Content: Thought content normal.        Judgment: Judgment normal.         Prior labs:   No results found for this or any previous visit (from  the past 2160  hours).  No results found for: CHOL No results found for: HDL No results found for: LDLCALC No results found for: TRIG No results found for: CHOLHDL No results found for: LDLDIRECT  Last metabolic panel No results found for: GLUCOSE, NA, K, CL, CO2, BUN, CREATININE, EGFR, CALCIUM, PHOS, PROT, ALBUMIN, LABGLOB, AGRATIO, BILITOT, ALKPHOS, AST, ALT, ANIONGAP  No results found for: HGBA1C  Last CBC No results found for: WBC, HGB, HCT, MCV, MCH, RDW, PLT  No results found for: TSH  No results found for: PSA1, PSA  Last vitamin D No results found for: 25OHVITD2, 25OHVITD3, VD25OH  No results found for: COLORU, CLARITYU, GLUCOSEUR, BILIRUBINUR, KETONESU, SPECGRAV, RBCUR, PHUR, PROTEINUR, UROBILINOGEN, LEUKOCYTESUR  No results found for: LABMICR, MICROALBUR   At today's visit, we discussed treatment options, associated risk and benefits, and engage in counseling as needed.  Additionally the following were reviewed: Past medical records, past medical and surgical history, family and social background, as well as relevant laboratory results, imaging findings, and specialty notes, where applicable.  This message was generated using dictation software, and as a result, it may contain unintentional typos or errors.  Nevertheless, extensive effort was made to accurately convey at the pertinent aspects of the patient visit.    There may have been are other unrelated non-urgent complaints, but due to the busy schedule and the amount of time already spent with him, time does not permit to address these issues at today's visit. Another appointment may have or has been requested to review these additional issues.     Beverley KATHEE Hummer, MD  I,Emily Lagle,acting as a scribe for Beverley KATHEE Hummer, MD.,have documented all relevant documentation on the behalf of Beverley KATHEE Hummer,  MD.  LILLETTE Beverley KATHEE Hummer, MD, have reviewed all documentation for this visit. The documentation on 07/30/2024 for the exam, diagnosis, procedures, and orders are all accurate and complete.

## 2024-07-30 NOTE — Patient Instructions (Signed)
 It was very nice to see you today!  VISIT SUMMARY: Today, we discussed your ongoing health issues, including atrial fibrillation, heart failure, hypertension, obesity, sleep apnea, insomnia, balance disturbance, and respiratory symptoms. We reviewed your current treatments and made plans for future care.  YOUR PLAN: ATRIAL FIBRILLATION: You have atrial fibrillation and have had five previous unsuccessful cardioversions. You are scheduled for another cardioversion on August 05, 2024. -Proceed with the scheduled cardioversion on August 05, 2024.  HEART FAILURE WITH PRESERVED EJECTION FRACTION: You have heart failure with preserved ejection fraction and are currently taking Bumex 1 mg three times a week. -Continue your current heart failure management regimen.  HYPERTENSION: Your blood pressure remains consistently high despite taking multiple medications. -Continue your current antihypertensive regimen.  OBESITY, CLASS 2: You have class 2 obesity with a BMI of 38.6 and have been actively trying to lose weight. -Continue your physical activity and healthy lifestyle modifications. -We will consider weight loss medications after your cardioversion, taking into account cost and insurance implications.  OBSTRUCTIVE SLEEP APNEA: You have obstructive sleep apnea and use a CPAP machine, but your compliance is intermittent. -Try to use your CPAP machine more consistently.  INSOMNIA: You have insomnia, which is currently managed with trazodone. -Continue taking trazodone for insomnia.  HYPERLIPIDEMIA: You have high cholesterol and are taking Lipitor, which may be causing balance issues. -Continue taking Lipitor for your cholesterol.  CHRONIC MUCUS PRODUCTION: You have difficulty expectorating phlegm and occasionally produce 'brown junk' when brushing your teeth or using mouthwash. -Try over-the-counter Mucinex (guaifenesin) to help thin the mucus.  GENERAL HEALTH MAINTENANCE: Your thyroid  function was previously checked and is normal. -Continue with routine health maintenance.  No follow-ups on file.   Take care, Arvella Hummer, MD, MS   PLEASE NOTE:  If you had any lab tests, please let us  know if you have not heard back within a few days. You may see your results on mychart before we have a chance to review them but we will give you a call once they are reviewed by us .   If we ordered any referrals today, please let us  know if you have not heard from their office within the next week.   If you had any urgent prescriptions sent in today, please check with the pharmacy within an hour of our visit to make sure the prescription was transmitted appropriately.   Please try these tips to maintain a healthy lifestyle:  Eat at least 3 REAL meals and 1-2 snacks per day.  Aim for no more than 5 hours between eating.  If you eat breakfast, please do so within one hour of getting up.   Each meal should contain half fruits/vegetables, one quarter protein, and one quarter carbs (no bigger than a computer mouse)  Cut down on sweet beverages. This includes juice, soda, and sweet tea.   Drink at least 1 glass of water with each meal and aim for at least 8 glasses per day  Exercise at least 150 minutes every week.

## 2024-08-14 LAB — HM DIABETES EYE EXAM

## 2024-08-19 ENCOUNTER — Ambulatory Visit: Payer: Self-pay

## 2024-08-19 NOTE — Telephone Encounter (Signed)
 Noted. Dm/cma

## 2024-08-19 NOTE — Telephone Encounter (Signed)
 FYI Only or Action Required?: FYI only for provider: appointment scheduled on 08/20/24.  Patient was last seen in primary care on 07/30/2024 by Sebastian Beverley NOVAK, MD.  Called Nurse Triage reporting Shortness of Breath and Cough.  Symptoms began several months ago.  Interventions attempted: Other: cardioversion 2 weeks ago, he states was successful.  Symptoms are: gradually worsening x couple of months.  Triage Disposition: See Physician Within 24 Hours (overriding See HCP Within 4 Hours (Or PCP Triage))  Patient/caregiver understands and will follow disposition?: Yes          Copied from CRM #[id number]. Topic: Clinical - Red Word Triage >> Aug 19, 2024  2:35 PM [name] wrote: [facility] that prompted transfer to Nurse Triage: shortness of breath Reason for Disposition  [1] MILD difficulty breathing (e.g., minimal/no SOB at rest, SOB with walking, pulse < 100) AND [2] NEW-onset or WORSE than normal  Answer Assessment - Initial Assessment Questions 1. RESPIRATORY STATUS: Describe your breathing? (e.g., wheezing, shortness of breath, unable to speak, severe coughing)      Shortness of breath.  2. ONSET: When did this breathing problem begin?      Worsening x 2 months.  3. PATTERN Does the difficult breathing come and go, or has it been constant since it started?      Constant when exerting himself. He states currently he is sitting in a chair and talking and no SOB.  4. SEVERITY: How bad is your breathing? (e.g., mild, moderate, severe)      Mild.  5. RECURRENT SYMPTOM: Have you had difficulty breathing before? If Yes, ask: When was the last time? and What happened that time?      Chronic, SOB for 30 years.  6. CARDIAC HISTORY: Do you have any history of heart disease? (e.g., heart attack, angina, bypass surgery, angioplasty)      A fib/a flutter, heart failure. Recent cardioversion 08/05/24.  7. LUNG HISTORY: Do you have any history of lung disease?  (e.g.,  pulmonary embolus, asthma, emphysema)     Restrictive lung disease, OSA.  8. CAUSE: What do you think is causing the breathing problem?      He states he was told his SOB was supposed to improve after his cardioversion and it has not; he has not  9. OTHER SYMPTOMS: Do you have any other symptoms? (e.g., chest pain, cough, dizziness, fever, runny nose)     Cough with brown colored mucous, states it feels stuck and difficulty loosening the mucous to cough it out. He states he wheezes quite often with it too. Denies chest pain, fever, hx of PE or DVT.  10. O2 SATURATION MONITOR:  Do you use an oxygen saturation monitor (pulse oximeter) at home? If Yes, ask: What is your reading (oxygen level) today? What is your usual oxygen saturation reading? (e.g., 95%)       No.  11. PREGNANCY: Is there any chance you are pregnant? When was your last menstrual period?       N/A  12. TRAVEL: Have you traveled out of the country in the last month? (e.g., travel history, exposures)       No.  Protocols used: Breathing Difficulty-A-AH

## 2024-08-20 ENCOUNTER — Encounter: Payer: Self-pay | Admitting: Family Medicine

## 2024-08-20 ENCOUNTER — Ambulatory Visit (INDEPENDENT_AMBULATORY_CARE_PROVIDER_SITE_OTHER): Admitting: Family Medicine

## 2024-08-20 ENCOUNTER — Ambulatory Visit

## 2024-08-20 VITALS — BP 150/86 | HR 70 | Temp 97.5°F | Ht 70.5 in | Wt 268.0 lb

## 2024-08-20 DIAGNOSIS — R053 Chronic cough: Secondary | ICD-10-CM | POA: Diagnosis not present

## 2024-08-20 DIAGNOSIS — R0602 Shortness of breath: Secondary | ICD-10-CM

## 2024-08-20 NOTE — Progress Notes (Signed)
 [facility] [address] [place] [address] Dept: [phone] Dept Fax: [phone]  Office Visit  Subjective:    Patient ID: Gabriel Higgins, male    DOB: Mar 31, 1941, 83 y.o..   MRN: [id number]  Chief Complaint  Patient presents with   Cough    C/o having SOB, cough x 2 months.   Has taken Mucinex .     History of Present Illness:  Patient is in today for complaints of shortness of breath and productive cough with brown mucus. He notes that this has been present for the past 2+ months. Gabriel Higgins has a history of HFpEF and pulmonary hypertension. He had a recent cardioversion 2 weeks ago. He notes he had hoped that this would resolve his dyspnea. He however continues to feel short of breath, particularly with exertion. He has a distant history of tobacco use, but quit 40 years ago. His chart indicates a history of restrictive lung disease. He notes his cough is productive of brown sputum at times.  Past Medical History: Patient Active Problem List   Diagnosis Date Noted   Class 2 severe obesity due to excess calories with serious comorbidity and body mass index (BMI) of 38.0 to 38.9 in adult 07/30/2024   Subacute cough 07/30/2024   Mixed hyperlipidemia 07/30/2024   Aortic valve stenosis 06/27/2024   Impaired functional mobility, balance, gait, and endurance 12/25/2023   (HFpEF) heart failure with preserved ejection fraction (HCC) 11/29/2022   Iron deficiency anemia due to chronic blood loss 09/15/2022   Atrial fibrillation (HCC) 09/08/2022   Chronic kidney disease (CKD), stage III (moderate) (HCC) 09/08/2022   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   Diabetes mellitus with stage 3 chronic kidney disease, without long-term current use of insulin (HCC) 11/06/2019   Pulmonary hypertension (HCC) 11/06/2019   Hyperphosphatemia 10/21/2019   Vitamin D deficiency 10/21/2019    Restrictive lung disease 03/29/2019   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   Benign essential tremor 04/17/2017   Early stage nonexudative age-related macular degeneration of both eyes 02/21/2017   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 Outpatient Medications Prior to Visit  Medication Sig Dispense Refill   amiodarone (PACERONE) 200 MG tablet Take 200 mg by mouth daily.     aspirin 81 MG tablet Take 81 mg by mouth daily.     bumetanide (BUMEX) 1 MG tablet Take 1 mg by mouth.     cyanocobalamin 100 MCG tablet Take 100 mcg by mouth.     dextromethorphan-guaiFENesin  (MUCINEX  DM) 30-600 MG 12hr tablet Take 1 tablet by mouth 2 (two) times daily.     diltiazem (CARDIZEM CD) 180 MG 24 hr capsule Take 180 mg by mouth.     hydrALAZINE (APRESOLINE) 100 MG tablet Take 100 mg by mouth 3 (three) times daily.     losartan (COZAAR) 100 MG tablet Take 100 mg by mouth daily.     Multiple Vitamin (MULTIVITAMIN) capsule Take 1 capsule by mouth daily.  pantoprazole (PROTONIX) 40 MG tablet Take 40 mg by mouth at bedtime. (Patient not taking: Reported on 07/30/2024)     propranolol (INDERAL) 10 MG tablet TAKE 2 TABLETS (20 MG TOTAL) BY MOUTH 3 TIMES DAILY.     traZODone (DESYREL) 50 MG tablet Take 50 mg by mouth at bedtime.     No facility-administered medications prior to visit.   Allergies  Allergen Reactions   Apixaban Other (See Comments)    Increased bleeding  Rectum bleeding  Rectum bleeding    Increased bleeding   Bee Venom  Anaphylaxis   Honey Bee Venom Anaphylaxis   Mirabegron Anaphylaxis, Hypertension, Other (See Comments), Palpitations and Photosensitivity    Atrial Fibrillation  Hypertension  Hypertension  Atrial Fibrillation  Other Reaction(s): Hypertension    Hypertension Atrial Fibrillation    Atrial Fibrillation   Lisinopril Cough   Atorvastatin Other (See Comments)     Objective:   Today's Vitals   08/20/24 1057  BP: (!) 150/86  Pulse: 70  Temp: (!) 97.5 F (36.4 C)  TempSrc: Temporal  SpO2: 97%  Weight: 268 lb (121.6 kg)  Height: 5' 10.5 (1.791 m)   Body mass index is 37.91 kg/m.   General: Well developed, well nourished. No acute distress. Lungs: Clear to auscultation bilaterally. No wheezing, rales or rhonchi. CV: RRR without murmurs or rubs, but occasional skip beats. Pulses 2+ bilaterally. Extremities: Trace to1+ lower leg edema noted. Psych: Alert and oriented. Normal mood and affect.  Health Maintenance Due  Topic Date Due   HEMOGLOBIN A1C  Never done   FOOT EXAM  Never done   Diabetic kidney evaluation - eGFR measurement  Never done   Diabetic kidney evaluation - Urine ACR  Never done   Medicare Annual Wellness (AWV)  12/14/2022   COVID-19 Vaccine (6 - 2025-26 season) 05/20/2024   OPHTHALMOLOGY EXAM  08/07/2024     Imaging: Chest x-ray: Normal cardiac size. No acute infiltrates noted.  Assessment & Plan:  1. Chronic cough (Primary) 2. Shortness of breath Gabriel Higgins is having a persistent cough and dyspnea. His chest x-ray does not show sign of an acute infection. He appears to be in a sinus rhythm. His weight is down 5 lbs and he has only a small amount of edema. I do not see signs of decompensated heart failure. In light of the history of restrictive lung disease, I wonder if there might be a primary pulmonary cause for his symptoms. I will refer him to pulmonology for an evaluation.  - DG Chest 2 View - Ambulatory referral to Pulmonology   Return in  about 4 weeks (around 09/17/2024) for Reassessment with PCP.    Garnette CHRISTELLA Simpler, MD  I,Emily Lagle,acting as a scribe for Garnette CHRISTELLA Simpler, MD.,have documented all relevant documentation on the behalf of Garnette CHRISTELLA Simpler, MD.  I, Garnette CHRISTELLA Simpler, MD, have reviewed all documentation for this visit. The documentation on 08/20/2024 for the exam, diagnosis, procedures, and orders are all accurate and complete.

## 2024-09-03 LAB — HM DIABETES EYE EXAM

## 2024-09-05 ENCOUNTER — Encounter: Payer: Self-pay | Admitting: Pulmonary Disease

## 2024-09-05 ENCOUNTER — Ambulatory Visit (INDEPENDENT_AMBULATORY_CARE_PROVIDER_SITE_OTHER): Admitting: Pulmonary Disease

## 2024-09-05 VITALS — BP 163/89 | HR 82 | Ht 70.0 in | Wt 262.0 lb

## 2024-09-05 DIAGNOSIS — R0609 Other forms of dyspnea: Secondary | ICD-10-CM

## 2024-09-05 DIAGNOSIS — J4489 Other specified chronic obstructive pulmonary disease: Secondary | ICD-10-CM

## 2024-09-05 MED ORDER — FLUTICASONE-SALMETEROL 500-50 MCG/ACT IN AEPB: 1.0000 | INHALATION_SPRAY | Freq: Two times a day (BID) | RESPIRATORY_TRACT | 3 refills | Status: DC

## 2024-09-05 NOTE — Progress Notes (Signed)
 @Patient  ID: Gabriel Higgins, male    DOB: [birth year], 83 y.o.   MRN: [id number]  Chief Complaint  Patient presents with   Establish Care    Referring provider: Thedora Garnette HERO, MD  HPI:   83 y.o. man with pulmonary venous hypertension/diastolic CHF as indicated by severely dilated atrium on TEE, atrial fibrillation status post watchman (unclear date) and cardioversion 07/2024 currently in sinus rhythm here for evaluation of dyspnea exertion.  Multiple prior cardiology notes reviewed.  Most recent PCP note reviewed.  Here for evaluation of shortness of breath.  Started several months ago.  He thinks less than a year.  Certainly no longer than that.  Denies any history of issues with breathing prior to that.  Short of breath with walking relatively short distance even on flat surface.  Worse inclines or stairs.  Accommodated better or worse.  Notes positionally seems better worse.  No other relieving or exacerbating factors.  On questioning he indicates he moved about 1 year ago.  From apartment to a retirement community.  In addition, he reports onset of symptoms of chest congestion, cough productive of phlegm around the same time.  He also has a lot of sinus congestion, postnasal drip symptoms.  He has never used inhalers.  Never diagnosed with asthma.  He reports no significant smoking history.  He has reviewed congestive heart failure as evidenced by several cardiology notes.  Pulmonary venous hypertension and diastolic dysfunction with left atrial dilation.  Normal EF.  He reports Watchman procedure in the past.  Recently cardioverted out of atrial fibrillation to sinus rhythm.  Seems atrial fibrillation has been a problem on and off.  Recent chest x-ray July 2025 reviewed, clear lungs with small right pleural effusion noted on my review and interpretation.   Questionaires / Pulmonary Flowsheets:   ACT:      No data to display          MMRC:     No data to display           Epworth:      No data to display          Tests:   FENO:  No results found for: NITRICOXIDE  PFT:     No data to display          WALK:      No data to display          Imaging: Personally reviewed meds per EMR and discussion with no DG Chest 2 View Result Date: 08/22/2024 CLINICAL DATA:  Cough EXAM: CHEST - 2 VIEW COMPARISON:  Chest x-ray performed August 18, 2023 FINDINGS: Heart mediastinum are not significantly changed. No focal consolidation, pleural effusion, or pneumothorax. Degenerative changes in the thoracic spine. IMPRESSION: 1. No focal consolidation. 2. No significant interval change. Electronically Signed   By: Maude Naegeli M.D.   On: 08/22/2024 15:26    Lab Results: Personally reviewed via Care Everywhere CBC No results found for: WBC, RBC, HGB, HCT, PLT, MCV, MCH, MCHC, RDW, LYMPHSABS, MONOABS, EOSABS, BASOSABS  BMET No results found for: NA, K, CL, CO2, GLUCOSE, BUN, CREATININE, CALCIUM, GFRNONAA, GFRAA  BNP No results found for: BNP  ProBNP No results found for: PROBNP  Specialty Problems       Pulmonary Problems   Obstructive sleep apnea syndrome   Restrictive lung disease   PFT 03/20/19 ratio 75% FEV1 67% FVC 67% DC 101%      Subacute cough    Allergies[1]  Immunization History  Administered Date(s) Administered   Fluad Quad(high Dose 65+) 05/30/2019   INFLUENZA, HIGH DOSE SEASONAL PF 07/20/2017, 06/29/2018, 07/28/2023   Influenza,inj,Quad PF,6+ Mos 08/16/2016   Influenza-Unspecified 08/16/2016, 06/27/2020, 07/07/2022, 07/20/2024   Moderna Covid-19 Fall Seasonal Vaccine 3yrs & older 07/07/2022   PFIZER Comirnaty(Gray Top)Covid-19 Tri-Sucrose Vaccine 08/04/2020, 02/27/2021   PFIZER(Purple Top)SARS-COV-2 Vaccination 10/10/2019, 11/14/2019   Pneumococcal Conjugate-13 02/25/2016   Pneumococcal Polysaccharide-23 11/22/2018   Respiratory Syncytial Virus Vaccine,Recomb  Aduvanted(Arexvy) 08/09/2022   Tdap 11/22/2018, 02/18/2023   Zoster Recombinant(Shingrix) 07/07/2021, 05/05/2022    Past Medical History:  Diagnosis Date   Hyperlipidemia    Hypertension     Tobacco History: Tobacco Use History[2] Counseling given: Not Answered   Continue to not smoke  Outpatient Encounter Medications as of 09/05/2024  Medication Sig   amiodarone (PACERONE) 200 MG tablet Take 200 mg by mouth daily.   aspirin 81 MG tablet Take 81 mg by mouth daily.   bumetanide (BUMEX) 1 MG tablet Take 1 mg by mouth.   cyanocobalamin 100 MCG tablet Take 100 mcg by mouth.   diltiazem (CARDIZEM CD) 180 MG 24 hr capsule Take 180 mg by mouth.   fluticasone -salmeterol (ADVAIR) 500-50 MCG/ACT AEPB Inhale 1 puff into the lungs in the morning and at bedtime.   hydrALAZINE (APRESOLINE) 100 MG tablet Take 100 mg by mouth 3 (three) times daily.   losartan (COZAAR) 100 MG tablet Take 100 mg by mouth daily.   Multiple Vitamin (MULTIVITAMIN) capsule Take 1 capsule by mouth daily.   propranolol (INDERAL) 10 MG tablet TAKE 2 TABLETS (20 MG TOTAL) BY MOUTH 3 TIMES DAILY.   traZODone (DESYREL) 50 MG tablet Take 50 mg by mouth at bedtime.   No facility-administered encounter medications on file as of 09/05/2024.     Review of Systems  Review of Systems  No chest pain exertion.  No orthopnea or PND.  Comprehensive review of systems otherwise negative. Physical Exam  BP (!) 163/89   Pulse 82   Ht 5' 10 (1.778 m)   Wt 262 lb (118.8 kg)   SpO2 95%   BMI 37.59 kg/m   Wt Readings from Last 5 Encounters:  09/05/24 262 lb (118.8 kg)  08/20/24 268 lb (121.6 kg)  07/30/24 273 lb (123.8 kg)  03/30/14 284 lb (128.8 kg)    BMI Readings from Last 5 Encounters:  09/05/24 37.59 kg/m  08/20/24 37.91 kg/m  07/30/24 38.62 kg/m  03/30/14 41.94 kg/m     Physical Exam General: Sitting in chair, no distress Eyes: EOMI, no icterus Neck: Supple, no JVD Pulmonary: Clear, good air  movement Cardiovascular: Regular rate and rhythm   Assessment & Plan:   Dyspnea on exertion: Suspect multifactoral related deconditioning, habitus, extraparenchymal restriction, kyphosis seen on chest imaging. The fact this occurred just 2 months ago though does beg question of some subacute process.  In particular with concomitant cough with phlegm production in the setting of change in environment, asthma is suspected.  No anemia based on review of records.  PFTs for further evaluation.  Recent chest imaging clear with kyphosis discussed above.  Asthma/chronic bronchitis: Clinical diagnosis based on cough with phlegm production and shortness of breath with similar timing of onset.  In the setting of moving from apartment to retirement home.  Suspect environmental change triggering allergic-like response.  Asthma or bronchitis.  Advair high-dose prescribed.  If too expensive need to consider manufacture assistance with triple inhaled therapy, Trelegy versus Breztri.  Notably, eosinophils significantly elevated as  high as 700 in 2024, more recently 400 on review of outside records.   Return in about 3 months (around 12/04/2024) for f/u Dr. Annella, after PFT.   Donnice JONELLE Annella, MD 09/05/2024   This appointment required 61 minutes of patient care (this includes precharting, chart review, review of results, face-to-face care, etc.).     [1]  Allergies Allergen Reactions   Apixaban Other (See Comments)    Increased bleeding  Rectum bleeding  Rectum bleeding    Increased bleeding   Bee Venom Anaphylaxis   Honey Bee Venom Anaphylaxis   Mirabegron Anaphylaxis, Hypertension, Other (See Comments), Palpitations and Photosensitivity    Atrial Fibrillation  Hypertension  Hypertension  Atrial Fibrillation  Other Reaction(s): Hypertension    Hypertension Atrial Fibrillation    Atrial Fibrillation   Lisinopril Cough   Atorvastatin Other (See Comments)  [2]  Social  History Tobacco Use  Smoking Status Never  Smokeless Tobacco Not on file

## 2024-09-05 NOTE — Patient Instructions (Signed)
 Nice to meet you  To further evaluate your shortness of breath I ordered pulmonary function test, this will give us  the most helpful data to understand your breathing and arrive at the most accurate diagnosis  With your sputum production and shortness of breath after moving, I think something like a development of asthma is most likely.  To treat this take Advair or Wixela 1 inhalation twice a day.  Rinse her mouth thoroughly with water after every use.  If this is too expensive send us  a message or call our office and leave a message.  We will look to find a solution.  Return to clinic in 3 months or sooner as needed with Dr. Annella, after PFT

## 2024-09-10 ENCOUNTER — Ambulatory Visit: Admitting: Family Medicine

## 2024-09-23 ENCOUNTER — Ambulatory Visit (INDEPENDENT_AMBULATORY_CARE_PROVIDER_SITE_OTHER): Admitting: Family Medicine

## 2024-09-23 VITALS — BP 161/89 | HR 95 | Temp 97.8°F | Ht 70.0 in | Wt 259.0 lb

## 2024-09-23 DIAGNOSIS — G4733 Obstructive sleep apnea (adult) (pediatric): Secondary | ICD-10-CM

## 2024-09-23 DIAGNOSIS — I350 Nonrheumatic aortic (valve) stenosis: Secondary | ICD-10-CM

## 2024-09-23 DIAGNOSIS — F4321 Adjustment disorder with depressed mood: Secondary | ICD-10-CM

## 2024-09-23 DIAGNOSIS — I10 Essential (primary) hypertension: Secondary | ICD-10-CM | POA: Diagnosis not present

## 2024-09-23 DIAGNOSIS — E1122 Type 2 diabetes mellitus with diabetic chronic kidney disease: Secondary | ICD-10-CM

## 2024-09-23 DIAGNOSIS — I480 Paroxysmal atrial fibrillation: Secondary | ICD-10-CM | POA: Diagnosis not present

## 2024-09-23 DIAGNOSIS — E559 Vitamin D deficiency, unspecified: Secondary | ICD-10-CM

## 2024-09-23 DIAGNOSIS — G250 Essential tremor: Secondary | ICD-10-CM

## 2024-09-23 DIAGNOSIS — Z8679 Personal history of other diseases of the circulatory system: Secondary | ICD-10-CM

## 2024-09-23 DIAGNOSIS — J4489 Other specified chronic obstructive pulmonary disease: Secondary | ICD-10-CM | POA: Diagnosis not present

## 2024-09-23 DIAGNOSIS — E782 Mixed hyperlipidemia: Secondary | ICD-10-CM

## 2024-09-23 DIAGNOSIS — N1831 Chronic kidney disease, stage 3a: Secondary | ICD-10-CM | POA: Diagnosis not present

## 2024-09-23 DIAGNOSIS — I5032 Chronic diastolic (congestive) heart failure: Secondary | ICD-10-CM

## 2024-09-23 LAB — POCT GLYCOSYLATED HEMOGLOBIN (HGB A1C): Hemoglobin A1C: 6.2 % — AB (ref 4.0–5.6)

## 2024-09-23 MED ORDER — PROPRANOLOL HCL ER 80 MG PO CP24: 80.0000 mg | ORAL_CAPSULE | Freq: Every day | ORAL | 3 refills | Status: AC

## 2024-09-23 MED ORDER — OZEMPIC (0.25 OR 0.5 MG/DOSE) 2 MG/3ML ~~LOC~~ SOPN: 0.2500 mg | PEN_INJECTOR | SUBCUTANEOUS | 1 refills | Status: DC

## 2024-09-23 NOTE — Progress Notes (Signed)
 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 Stage 3 chronic kidney disease is present. - Started Ozempic  0.25 mg once weekly for blood sugar management and cardiovascular protection. - Ordered for next visit hemoglobin A1c, lipid panel, microalbumin and creatinine ratio, PTH, phosphorus, calcium, urinalysis with microscopic plus reflex culture, and TSH reflex to assess diabetes and renal function. - Referred to pharmacist for assistance with prescription assistance and administration.  Essential hypertension Blood pressure is elevated, likely due to stress. Propranolol  may help manage blood pressure and stress response. - Continue propranolol  for blood pressure management. - Monitor blood pressure at home.  Severe obesity - Recommend low-carb diet and exercise to help manage obesity  Obstructive sleep apnea Continues to use CPAP for management. - Continue CPAP therapy.  Mixed hyperlipidemia Requires monitoring. - Ordered fasting lipid panel at next visit.  Other specified chronic obstructive pulmonary disease Advair medication was discontinued due to adverse effects. - Follow-up with pulmonology for chronic cough management.  Adjustment disorder with depressed mood Experiencing significant stress and mood changes due to recent life changes. Prefers to start with therapy. - Referred to behavioral health for therapy. - Referral to social work to help locate behavioral health resources  Vitamin D deficiency Associated with chronic kidney disease. - Ordered PTH, phosphorus, and calcium from complete metabolic panel to assess calcium management.  General Health Maintenance Engages in regular exercise and extracurricular activities. - Continue regular exercise and extracurricular activities.  Essential tremor Not well-controlled on  the propranolol  immediate release.  Difficult adherence due to 3 times daily dosing.  Recommend trying once daily extended release. - Discontinue immediate release propranolol  20 mg 3 times daily - Start propranolol  ER 80 mg 24-hour capsule daily  HFpEF  Patient on goal-directed medical therapy.  Patient follows with cardiology. - Continue losartan 100 mg daily - Continue hydralazine 100 mg 3 times daily - Continue Bumex daily - Follow-up with cardiology  Atrial fibrillation status post ablation History of Watchman procedure with recent ablation.  Current rate control with diltiazem and amiodarone. - Continue amiodarone 200 mg daily - Continue diltiazem 150 mg 24-hour capsule daily - Follow-up with cardiology      Medications Discontinued During This Encounter  Medication Reason   propranolol  (INDERAL ) 10 MG tablet     Return in about 3 months (around 12/22/2024) for DM, HLD, BP.        Subjective:   Encounter date: 09/23/2024  Gabriel Higgins is a 84 y.o. male who has (HFpEF) heart failure with preserved ejection fraction (HCC); Aortic valve stenosis; Paroxysmal atrial fibrillation (HCC); Benign essential tremor; Chronic kidney disease (CKD), stage III (moderate) (HCC); Benign prostatic hyperplasia; Diabetes mellitus with stage 3 chronic kidney disease, without long-term current use of insulin (HCC); Early stage nonexudative age-related macular degeneration of both eyes; Essential hypertension; Gastroesophageal reflux disease without esophagitis; Hyperphosphatemia; Hypogonadism male; Impaired functional mobility, balance, gait, and endurance; Iron deficiency anemia due to chronic blood loss; 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   He  has a  past medical history of Hyperlipidemia and Hypertension.Gabriel Higgins   He presents with chief complaint of Medical Management of Chronic Issues (Patient presents today for a weight management follow up as well as a blood pressure and blood sugar, Pt would like to discuss his terrible cough. Pt seen a pulmonologist and was given a medication and after 3 days the patient was unable to swallow. ) .   Discussed the use of AI scribe software for clinical note transcription with the patient, who gave verbal consent to proceed.  History of Present Illness Gabriel Higgins is an 84 year old male with hypertension, prediabetes, and chronic cough who presents for follow-up on blood pressure, diabetes, chronic cough, and weight management.  Hypertension and essential tremor - Blood pressure managed with daily medication, including propranolol  10 mg twice daily, taken in the morning and at night. - Propranolol  also used for essential tremor. - Feels blood pressure may be elevated due to recent emotional stress. - No falls, but has come close to falling.  Prediabetes - Recent blood sugar measured at 6.3 mmol/L. - No diagnosis of diabetes. - Aware of prediabetic status.  Chronic cough and respiratory symptoms - Chronic cough persists. - Previously used inhaled medication requiring mouth rinsing, discontinued after three days due to difficulty swallowing and persistent GERD symptoms. - Minimal improvement in breathing following cardioversion on August 05, 2024.  Weight management and physical activity - Engages in daily exercise routines at home. - Performs leg exercises with 270 lbs for 50 reps and arm exercises with 70 lbs for 30 reps. - Uses a bicycle machine for 5 to 15 minutes,  aiming for 2000 steps per session.  Psychosocial stressors and emotional well-being - Experiencing significant emotional distress due to recent separation from wife of 39 years. - Lives alone and feels lonely. - Concerned for wife's health. - Considering speaking with a counselor for emotional support. - No thoughts of self-harm.      07/30/2024    2:23 PM  Depression screen PHQ 2/9  Decreased Interest 0  Down, Depressed, Hopeless 0  PHQ - 2 Score 0  Altered sleeping 0  Tired, decreased energy 0  Change in appetite 0  Feeling bad or failure about yourself  0  Trouble concentrating 0  Moving slowly or fidgety/restless 0  Suicidal thoughts 0  PHQ-9 Score 0  Difficult doing work/chores Not difficult at all     ROS  Past Surgical History:  Procedure Laterality Date   APPENDECTOMY     BACK SURGERY     CHOLECYSTECTOMY     REPLACEMENT TOTAL KNEE Bilateral    SHOULDER SURGERY      Outpatient Medications Prior to Visit  Medication Sig Dispense Refill   amiodarone (PACERONE) 200 MG tablet Take 200 mg by mouth daily.     aspirin 81 MG tablet Take 81 mg by mouth daily.     bumetanide (BUMEX) 1 MG tablet Take 1 mg by mouth.     cyanocobalamin 100 MCG tablet Take 100 mcg by mouth.     diltiazem (CARDIZEM CD) 180 MG 24 hr capsule Take 180 mg by mouth.     fluticasone -salmeterol (ADVAIR) 500-50 MCG/ACT AEPB Inhale 1 puff into the lungs in the morning and at bedtime. 60 each 3   hydrALAZINE (APRESOLINE) 100 MG tablet Take 100 mg by mouth 3 (three) times daily.     losartan (COZAAR) 100 MG tablet Take 100 mg by mouth daily.     Multiple Vitamin (MULTIVITAMIN) capsule Take 1 capsule  by mouth daily.     traZODone (DESYREL) 50 MG tablet Take 50 mg by mouth at bedtime.     propranolol  (INDERAL ) 10 MG tablet TAKE 2 TABLETS (20 MG TOTAL) BY MOUTH 3 TIMES DAILY.     No facility-administered medications prior to visit.    No family history on file.  Social History   Socioeconomic  History   Marital status: Married    Spouse name: Not on file   Number of children: Not on file   Years of education: Not on file   Highest education level: Not on file  Occupational History   Not on file  Tobacco Use   Smoking status: Never   Smokeless tobacco: Not on file  Vaping Use   Vaping status: Never Used  Substance and Sexual Activity   Alcohol use: No   Drug use: No   Sexual activity: Not Currently    Birth control/protection: None  Other Topics Concern   Not on file  Social History Narrative   Not on file   Social Drivers of Health   Tobacco Use: Unknown (09/05/2024)   Patient History    Smoking Tobacco Use: Never    Smokeless Tobacco Use: Unknown    Passive Exposure: Not on file  Recent Concern: Tobacco Use - Medium Risk (08/14/2024)   Received from [facility]   Patient History    Smoking Tobacco Use: Former    Smokeless Tobacco Use: Never    Passive Exposure: Past  Physicist, Medical Strain: Not on file  Food Insecurity: Low Risk (07/26/2024)   Received from [facility]   Epic    Within the past 12 months, you worried that your food would run out before you got money to buy more: Never true    Within the past 12 months, the food you bought just didn't last and you didn't have money to get more. : Never true  Transportation Needs: No Transportation Needs (02/19/2023)   Received from [facility]    In the past 12 months, has lack of reliable transportation kept you from medical appointments, meetings, work or from getting things needed for daily living? : No  Physical Activity: Not on file  Stress: Not on file  Social Connections: Unknown (02/01/2022)   Received from [facility]   Social Network    Social Network: Not on file  Intimate Partner Violence: Low Risk  (11/03/2022)   Received from [facility] visits prior to 11/19/2022.   Safety    How often does anyone, including family and friends, physically hurt  you?: Never    How often does anyone, including family and friends, insult or talk down to you?: Never    How often does anyone, including family and friends, threaten you with harm?: Never    How often does anyone, including family and friends, scream or curse at you?: Never  Depression (PHQ2-9): Low Risk (07/30/2024)   Depression (PHQ2-9)    PHQ-2 Score: 0  Alcohol Screen: Not on file  Housing: Low Risk (07/26/2024)   Received from [facility]   Epic    What is your living situation today?: I have a steady place to live    Think about the place you live. Do you have problems with any of the following? Choose all that apply:: Not on file  Utilities: Low Risk (02/19/2023)   Received from [facility]   Utilities    In the past 12 months has  the electric, gas, oil, or water company threatened to shut off services in your home? : No  Health Literacy: Not on file                                                                                                  Objective:  Physical Exam: BP (!) 161/89   Pulse 95   Temp 97.8 F (36.6 C)   Ht 5' 10 (1.778 m)   Wt 259 lb (117.5 kg) Comment: morning weight from home  SpO2 95%   BMI 37.16 kg/m    Physical Exam GENERAL: Alert, cooperative, well developed, no acute distress HEENT: Normocephalic, normal oropharynx, moist mucous membranes CHEST: Clear to auscultation bilaterally, no wheezes, rhonchi, or crackles CARDIOVASCULAR: Heart with occasional ectopic beats, otherwise regular, S1 and S2 normal without murmurs ABDOMEN: Soft, non-tender, non-distended, without organomegaly, normal bowel sounds EXTREMITIES: No cyanosis or edema NEUROLOGICAL: Cranial nerves grossly intact, moves all extremities without gross motor or sensory deficit   Physical Exam  DG Chest 2 View Result Date: 08/22/2024 CLINICAL DATA:  Cough EXAM: CHEST - 2 VIEW COMPARISON:  Chest x-ray performed August 18, 2023 FINDINGS: Heart mediastinum are not  significantly changed. No focal consolidation, pleural effusion, or pneumothorax. Degenerative changes in the thoracic spine. IMPRESSION: 1. No focal consolidation. 2. No significant interval change. Electronically Signed   By: Maude Naegeli M.D.   On: 08/22/2024 15:26    No results found for this or any previous visit (from the past 2160 hours).      Beverley Adine Hummer, MD, MS "

## 2024-09-23 NOTE — Patient Instructions (Addendum)
 It was very nice to see you today!  VISIT SUMMARY: Today, we discussed your blood pressure, prediabetes, chronic cough, weight management, and emotional well-being. We also reviewed your current medications and made some adjustments to better manage your conditions.  YOUR PLAN: TYPE 2 DIABETES MELLITUS WITH STAGE 3 CHRONIC KIDNEY DISEASE: Your blood sugar is well-controlled, but you have stage 3 chronic kidney disease. -Start taking Ozempic  0.25 mg once weekly for blood sugar management and cardiovascular protection. -We ordered several tests to monitor your diabetes and kidney function, including hemoglobin A1c, lipid panel, microalbumin and creatinine ratio, PTH, phosphorus, calcium, urinalysis with microscopic plus reflex culture, and TSH reflex.  CHRONIC HEART FAILURE WITH PRESERVED EJECTION FRACTION: Your propranolol  may help with blood pressure control and stress response. -Monitor your blood pressure at home regularly.  ESSENTIAL HYPERTENSION: Your blood pressure is elevated, likely due to stress. -Continue taking propranolol  for blood pressure management. -Monitor your blood pressure at home regularly.  SEVERE OBESITY: We discussed the potential benefits of Ozempic  for weight management and cardiovascular protection. -Start taking Ozempic  0.25 mg once weekly for weight management. -We referred you to a pharmacist for assistance with prescription assistance and administration.  OBSTRUCTIVE SLEEP APNEA: You are continuing to use CPAP for management. -Continue using your CPAP therapy as prescribed.  MIXED HYPERLIPIDEMIA: Your cholesterol levels need monitoring. -We ordered a fasting lipid panel for your next visit.  OTHER SPECIFIED CHRONIC OBSTRUCTIVE PULMONARY DISEASE: Your previous medication was discontinued due to adverse effects. -We referred you to a pulmonologist for chronic cough management.  ADJUSTMENT DISORDER WITH DEPRESSED MOOD: You are experiencing significant stress  and mood changes due to recent life changes. -We referred you to behavioral health for therapy.  VITAMIN D DEFICIENCY: This is associated with your chronic kidney disease. -We ordered tests to assess your calcium management, including PTH, phosphorus, and calcium from a complete metabolic panel.  GENERAL HEALTH MAINTENANCE: You are engaging in regular exercise and extracurricular activities. -Continue your regular exercise and extracurricular activities.  Return in about 3 months (around 12/22/2024) for DM, HLD, BP.   Take care, Arvella Hummer, MD, MS   PLEASE NOTE:  If you had any lab tests, please let us  know if you have not heard back within a few days. You may see your results on mychart before we have a chance to review them but we will give you a call once they are reviewed by us .   If we ordered any referrals today, please let us  know if you have not heard from their office within the next week.   If you had any urgent prescriptions sent in today, please check with the pharmacy within an hour of our visit to make sure the prescription was transmitted appropriately.   Please try these tips to maintain a healthy lifestyle:  Eat at least 3 REAL meals and 1-2 snacks per day.  Aim for no more than 5 hours between eating.  If you eat breakfast, please do so within one hour of getting up.   Each meal should contain half fruits/vegetables, one quarter protein, and one quarter carbs (no bigger than a computer mouse)  Cut down on sweet beverages. This includes juice, soda, and sweet tea.   Drink at least 1 glass of water with each meal and aim for at least 8 glasses per day  Exercise at least 150 minutes every week.

## 2024-09-27 ENCOUNTER — Telehealth: Payer: Self-pay

## 2024-09-27 NOTE — Progress Notes (Signed)
 Complex Care Management Note  Care Guide Note 09/27/2024 Name: [name] MRN: [id number] DOB: October 23, 1940  Gabriel Higgins is a 84 y.o. year old male who sees Sebastian Beverley NOVAK, MD for primary care. I reached out to [facility] by phone today to offer complex care management services.  [name] was given information about Complex Care Management services today including:   The Complex Care Management services include support from the care team which includes your Nurse Care Manager, Clinical Social Worker, or Pharmacist.  The Complex Care Management team is here to help remove barriers to the health concerns and goals most important to you. Complex Care Management services are voluntary, and the patient may decline or stop services at any time by request to their care team member.   Complex Care Management Consent Status: Patient agreed to services and verbal consent obtained.   Follow up plan:  Telephone appointment with complex care management team member scheduled for:  10/04/24 & 10/08/24.  Encounter Outcome:  Patient Scheduled  Dreama [facility]  [facility], [facility] Assistant Direct Dial: [phone]  Fax: [phone]

## 2024-10-02 ENCOUNTER — Ambulatory Visit: Admitting: Family Medicine

## 2024-10-04 ENCOUNTER — Other Ambulatory Visit: Payer: Self-pay | Admitting: Licensed Clinical Social Worker

## 2024-10-04 NOTE — Patient Instructions (Signed)
 Visit Information  Thank you for taking time to visit with me today. Please don't hesitate to contact me if I can be of assistance to you in the future.   Please call the care guide team at [phone] if you need to cancel or reschedule your appointment.   Following is a copy of your care plan:   Goals Addressed   None     Please call 911 if you are experiencing a Mental Health or Behavioral Health Crisis or need someone to talk to.  Patient verbalized understanding of Care plan and visit instructions communicated this visit  Cena Ligas, LCSW Clinical Social Worker [facility] [employer]

## 2024-10-04 NOTE — Patient Outreach (Signed)
 LCSW called patient on the phone. LCSW introduced self and explained reason for the call. Patient reports that he is interested in a therapist. LCSW sent patient a list of providers from his insurance providers website by email [email or web address]. Patient reports he has no other social work needs at this time. Patient declined a follow up call.  Cena Ligas, LCSW Clinical Social Worker [facility]

## 2024-10-07 ENCOUNTER — Ambulatory Visit: Payer: Self-pay

## 2024-10-07 ENCOUNTER — Telehealth: Payer: Self-pay | Admitting: Licensed Clinical Social Worker

## 2024-10-07 ENCOUNTER — Ambulatory Visit: Payer: Self-pay | Admitting: Pulmonary Disease

## 2024-10-07 ENCOUNTER — Telehealth: Payer: Self-pay

## 2024-10-07 NOTE — Telephone Encounter (Signed)
 Dr. Annella, Patient is complaining about the Advair powder inhaler and is requesting a different inhaler.  Please advise.  Thank you.

## 2024-10-07 NOTE — Telephone Encounter (Signed)
 FYI Only or Action Required?: Action required by provider: clinical question for provider.  Patient was last seen in primary care on 09/23/2024 by Sebastian Beverley NOVAK, MD.  Called Nurse Triage reporting Medication Problem.  Triage Disposition: Call Specialist Today (overriding Call PCP When Office is Open)  Patient/caregiver understands and will follow disposition?: Yes                    Reason for Disposition  [1] Caller has NON-URGENT medicine question about med that PCP prescribed AND [2] triager unable to answer question  Answer Assessment - Initial Assessment Questions RN triaging patient for PCP related issues,during that triage he mentioned he has been trying to call his pulmonary office to discuss the medication. Confirmed telephone # with patient and he states it doesn't work when he calls. RN offered to transfer to clinic to discuss or book an appointment with pulmonary provider to discuss, he declined and would like a message sent instead.  1. NAME of MEDICINE: What medicine(s) are you calling about?     Advair inhaler.  2. QUESTION: What is your question? (e.g., double dose of medicine, side effect)     Patient would like a different medication prescribed. He states it isn't working out for him and mentions it is too much of an ordeal. He doesn't like the powder it leaves in his mouth and having to rinse his mouth out.   3. PRESCRIBER: Who prescribed the medicine? Reason: if prescribed by specialist, call should be referred to that group.     Dr Annella.  4. SYMPTOMS: Do you have any symptoms? If Yes, ask: What symptoms are you having?  How bad are the symptoms (e.g., mild, moderate, severe)     None.  Protocols used: Medication Question Call-A-AH

## 2024-10-07 NOTE — Telephone Encounter (Signed)
 Hey Dr. Sebastian ,   Mr. Gabriel Higgins is experiencing acid reflux LOV: 09/23/2024, patient wants us  to send in Prevacid and does not wants to be seen

## 2024-10-07 NOTE — Patient Outreach (Signed)
 LCSW received phone call from patient. Patient is interested in LCSW doing a [facility] referral for therapy. Patient prefers [facility] contact him by email. LCSW completed referral. LCSW will follow up with patient on 10/14/24 at 11am.  Cena Ligas, LCSW Clinical Social Worker [facility]

## 2024-10-07 NOTE — Telephone Encounter (Signed)
 Copied from CRM [id number]. Topic: Clinical - Medication Question >> Oct 07, 2024  1:11 PM Berneda FALCON wrote: Reason for CRM: Patient states he has been having acid reflux for about a week and would like PCP to order him Prevacid for this. I let him know that PCP would likely want to see him in office before prescribing a new medication for him, but patient does not wish to have an appt. States he knows exactly what it is when it happens and would prefer to just try the medication to see if that helps first.  Patient is requesting the following medication: Prevacid   Pharmacy: [id number] - [place], [place] - [address] [place] [address] Phone: [phone] Fax: [phone] Hours: Not open 24 hours  Patient callback is [phone] (home), please leave vm if you get his voicemail

## 2024-10-07 NOTE — Telephone Encounter (Signed)
 FYI Only or Action Required?: Action required by provider: new medication request; refused office visit.  Patient was last seen in primary care on 09/23/2024 by Sebastian Beverley NOVAK, MD.  Called Nurse Triage reporting Chest Pain and Gastroesophageal Reflux.  Symptoms began several days ago.  Interventions attempted: Other: drinks water and stops eating.  Symptoms are: completely resolved at this time.  Triage Disposition: See Physician Within 24 Hours  Patient/caregiver understands and will follow disposition?: No, wishes to speak with PCP                    Message from [facility] F sent at 10/07/2024  1:18 PM EST  Summary: Initial complaint-chest pain with acid reflux-changed to just acid reflux   Reason for Triage: Patient called in stating he had acid reflux, and that when it happens he has chest pain. I asked him additional questions about the chest pain, but he backtracked and said it is not chest pain, just reflux and was requesting that PCP send in medication for him. I let him know PCP would likely need to see him in office before he would prescribe anything new, but patient requested I send the request anyway and stated he knows exactly what it is when it happens.  Per NT chat, I am to triage this call, but patient has already disconnected. Please call patient back at [phone] (home)         Reason for Disposition  [1] Chest pain lasts > 5 minutes AND [2] occurred > 3 days ago (72 hours) AND [3] NO chest pain or cardiac symptoms now  Answer Assessment - Initial Assessment Questions Anything with a little bit of tang, gets in my chest, I cannot eat, its stops the situation immediately Has to stop eating. Tries drinking water. Kathline Prevacid is the best medication for this. Patient refused office visit, states his PCP is booked out and he doesn't want to see another provider because every time he has to re-explain the entire story. Wants PCP to prescribe a new  medication for GERD.  1. LOCATION: Where does it hurt?       High, middle in the chest.  2. RADIATION: Does the pain go anywhere else? (e.g., into neck, jaw, arms, back)     No.  3. ONSET: When did the chest pain begin? (Minutes, hours or days)      Few nights ago, episode occurred after eating. Triggered by lasagna, potato chips,  navel orange the other night.  4. PATTERN: Does the pain come and go, or has it been constant since it started?  Does it get worse with exertion?      Comes and goes but describes it as constant when it does occur.  5. DURATION: How long does it last (e.g., seconds, minutes, hours)     Minutes, don't ask me to tell how many, I have not recorded that  6. SEVERITY: How bad is the pain?  (e.g., Scale 1-10; mild, moderate, or severe)     No pain present at this time.  7. CARDIAC RISK FACTORS: Do you have any history of heart problems or risk factors for heart disease? (e.g., angina, prior heart attack; diabetes, high blood pressure, high cholesterol, smoker, or strong family history of heart disease)     Per chart: (HFpEF) heart failure with preserved ejection fraction (HCC) Aortic valve stenosis Paroxysmal atrial fibrillation (HCC) Essential hypertension    8. PULMONARY RISK FACTORS: Do you have any history of lung  disease?  (e.g., blood clots in lung, asthma, emphysema, birth control pills)     Per chart: COPD, pulmonary HTN.  9. CAUSE: What do you think is causing the chest pain?     GERD, states this feels like his acid reflex. Could be stress related too, just split with his wife.  10. OTHER SYMPTOMS: Do you have any other symptoms? (e.g., dizziness, nausea, vomiting, sweating, fever, difficulty breathing, cough)       No chest pain at this time, difficulty breathing, radiating pain, LOC or syncope, sweating, hemoptysis , drug use, nausea, vomiting.  Protocols used: Chest Pain-A-AH

## 2024-10-08 ENCOUNTER — Other Ambulatory Visit: Payer: Self-pay

## 2024-10-08 DIAGNOSIS — N1831 Chronic kidney disease, stage 3a: Secondary | ICD-10-CM

## 2024-10-08 DIAGNOSIS — G250 Essential tremor: Secondary | ICD-10-CM

## 2024-10-08 DIAGNOSIS — E1122 Type 2 diabetes mellitus with diabetic chronic kidney disease: Secondary | ICD-10-CM

## 2024-10-08 DIAGNOSIS — E782 Mixed hyperlipidemia: Secondary | ICD-10-CM

## 2024-10-08 DIAGNOSIS — I10 Essential (primary) hypertension: Secondary | ICD-10-CM

## 2024-10-08 MED ORDER — BUDESONIDE-FORMOTEROL FUMARATE 160-4.5 MCG/ACT IN AERO: 2.0000 | INHALATION_SPRAY | Freq: Two times a day (BID) | RESPIRATORY_TRACT | 12 refills | Status: DC

## 2024-10-08 NOTE — Telephone Encounter (Signed)
 I called and spoke to Gabriel Higgins. Gabriel Higgins informed of Dr Sherry note and stated he will not take anything powdered and refused the Symbicort  as well.

## 2024-10-08 NOTE — Telephone Encounter (Signed)
 Sent budesonide /formoterol  2 puffs twice a day every day.  Still need to rinse out mouth after every use.  The rinsing of the mouth is going to be needed for almost every inhaler.  But I understand the powder can be irritating.

## 2024-10-08 NOTE — Telephone Encounter (Signed)
 Pt is scheduled for 10/09/24 at 2:20pm

## 2024-10-09 ENCOUNTER — Telehealth: Payer: Self-pay

## 2024-10-09 ENCOUNTER — Ambulatory Visit: Admitting: Family Medicine

## 2024-10-09 DIAGNOSIS — K219 Gastro-esophageal reflux disease without esophagitis: Secondary | ICD-10-CM

## 2024-10-09 MED ORDER — FAMOTIDINE 20 MG PO TABS: 20.0000 mg | ORAL_TABLET | Freq: Two times a day (BID) | ORAL | 0 refills | Status: DC

## 2024-10-09 NOTE — Progress Notes (Signed)
 "  10/08/24 Name: Gabriel Higgins MRN: [id number] DOB: [birth year]  Chief Complaint  Patient presents with   Diabetes Management Plan   Gabriel Higgins is a 84 y.o. year old male who presented for a telephone visit.   They were referred to the pharmacist by their PCP for assistance in managing diabetes and medication access.   Subjective:  Care Team: Primary Care Provider: Sebastian Beverley NOVAK, MD ; Next Scheduled Visit: 10/09/24  Medication Access/Adherence  Current Pharmacy:  [facility] [phone] [facility] [place],  - [address] [place] [address] Phone: [phone] Fax: [phone]  Patient reports affordability concerns with their medications: No  Patient reports access/transportation concerns to their pharmacy: No  Patient reports adherence concerns with their medications:  Yes    Diabetes: Current medications: none -Medications tried in the past: no history of diabetes medications noted in chart -Patient recently prescribed Ozempic  0.25/0.5mg  weekly, but he has not picked up this medication and does not understand why it was prescribed -Patient states his A1c has not been indicative of diabetes, and he does not wish to take medication for diabetes -He does not monitor home BG  Macrovascular and Microvascular Risk Reduction:  Statin? no; patient previously intolerant to statin therapy; atorvastatin caused fatigue ACEi/ARB? yes (losartan 100mg  daily) Last urinary albumin/creatinine ratio:   Last eye exam:  Lab Results  Component Value Date   HMDIABEYEEXA No Retinopathy 09/03/2024   Last foot exam: No foot exam found Tobacco Use:  Tobacco Use: Unknown (09/05/2024)   Patient History    Smoking Tobacco Use: Never    Smokeless Tobacco Use: Unknown    Passive Exposure: Not on file  Recent Concern: Tobacco Use - Medium Risk (08/14/2024)   Received from [facility]   Patient History    Smoking Tobacco Use: Former    Smokeless Tobacco Use:  Never    Passive Exposure: Past   Hypertension: Current medications: losartan 100mg  daily, hydralazine 100mg  TID, bumetanide 1mg  every MWF -Patient also prescribed propranolol  for essential tremor, which can also provide some BP lowering benefit -Propranolol  was recently changed from IR 10mg  tablets taking 2 TID to propranolol  ER 80mg  daily, but patient states he was not aware of this change and has continued taking IR tablets.  It does not appear that propranolol  ER 80mg  has been filled by the pharmacy. -OV BP elevated at 1/5 PCP visit- 161/89  Hyperlipidemia/ASCVD Risk Reduction Current lipid lowering medications: none -Medications tried in the past: atorvastatin caused fatigue per chart notes -Last lipid panel results available are from 04/19/24 and reflect TC 201, TG 295, HDL 48, LDL 114  Antiplatelet regimen: ASA 81mg  daily  ASCVD History: A-fib, HFpEF Family History: none on file Risk Factors: DM, HTN, ASCVD, obesity  Objective:  Lab Results  Component Value Date   HGBA1C 6.2 (A) 09/23/2024   Medications Reviewed Today     Reviewed by Deanna Channing LABOR, [facility] (Pharmacist) on 10/09/24 at 0755  Med List Status: <None>   Medication Order Taking? Sig Documenting Provider Last Dose Status Informant  amiodarone (PACERONE) 200 MG tablet [id number] Yes Take 200 mg by mouth daily. [provider]  Active   aspirin 81 MG tablet [id number]  Take 81 mg by mouth daily. [provider]  Active     Discontinued 10/09/24 0753 (Change in therapy)   bumetanide (BUMEX) 1 MG tablet [id number] Yes Take 1 mg by mouth.  Patient taking differently: Take 1 mg by mouth. Every Monday, Wednesday, and Friday  [provider]  Active   cyanocobalamin 100 MCG tablet [id number]  Take 100 mcg by mouth. [provider]  Active   diltiazem (CARDIZEM CD) 180 MG 24 hr capsule [id number] Yes Take 180 mg by mouth. [provider]  Active     Discontinued 10/08/24 1515    fluticasone -salmeterol (WIXELA INHUB) 250-50 MCG/ACT AEPB [id number] Yes Inhale 1 puff into the lungs in the morning and at bedtime. [provider]  Active   hydrALAZINE (APRESOLINE) 100 MG tablet [id number] Yes Take 100 mg by mouth 3 (three) times daily. [provider]  Active   losartan (COZAAR) 100 MG tablet [id number] Yes Take 100 mg by mouth daily. [provider]  Active   Multiple Vitamin (MULTIVITAMIN) capsule [id number]  Take 1 capsule by mouth daily. [provider]  Active   propranolol  (INDERAL ) 10 MG tablet [id number] Yes Take 20 mg by mouth 3 (three) times daily. [provider]  Active   propranolol  ER (INDERAL  LA) 80 MG 24 hr capsule [id number]  Take 1 capsule (80 mg total) by mouth daily.  Patient not taking: Reported on 10/09/2024   Sebastian Beverley NOVAK, MD  Active   Semaglutide ,0.25 or 0.5MG /DOS, (OZEMPIC , 0.25 OR 0.5 MG/DOSE,) 2 MG/3ML SOPN [id number]  Inject 0.25 mg into the skin once a week. Sebastian Beverley NOVAK, MD  Active   traZODone (DESYREL) 50 MG tablet [id number] Yes Take 50 mg by mouth at bedtime. [provider]  Active            Assessment/Plan:   Diabetes: -Currently controlled; goal A1c <7%. Cardiorenal risk reduction has opportunities for improvement.. Blood pressure is not at goal <130/80. LDL is at goal.  -Discussed cardiorenal benefit of Ozempic  in patients with diabetes; patient does not wish to initiate therapy at this time.  Will advise PCP, as he is seeing Mr. Brandhorst tomorrow.  Hypertension: -Currently uncontrolled -I do recommend that patient contact pharmacy to fill propranolol  ER 80mg  and stop IR tablets, as this will help with adherence and will hopefully provide tremor and BP improvement -If BP consistently >140/90 (based on patient age), could consider addition of spironolactone if potassium is within normal limits.  Lab orders are in, but these were not drawn at 1/5 visit.  I recommend patient  complete at PCP visit tomorrow.  Hyperlipidemia/ASCVD Risk Reduction: -Currently uncontrolled.  -I recommend patient have labs drawn at PCP visit tomorrow.  If cholesterol levels remain elevated, he would likely benefit from treatment with statin or alternative therapy.  Since atorvastatin caused fatigue in the past,  could consider lower intensity statin like pravastatin 10mg .  Channing DELENA Mealing, PharmD, DPLA    "

## 2024-10-09 NOTE — Telephone Encounter (Signed)
 Copied from CRM #[id number]. Topic: Clinical - Medication Question >> Oct 09, 2024  8:31 AM Rea BROCKS wrote: Reason for CRM: Requesting pepcid  for GERD   [id number] [place] [place] - [address] [place] [address] Phone: [phone] Fax: [phone] Hours: Not open 24 hours   Pt was supposed to have an appointment today but it was cancelled due to provider being out of office. Asked pt would he like another appt with diff provider today but pt is asking if pepcid  can be called in for him?   [phone] (M)

## 2024-10-09 NOTE — Telephone Encounter (Signed)
 Pt requesting pepcid  for gerd   Please advise as the doc of the day

## 2024-10-09 NOTE — Progress Notes (Incomplete)
 " Assessment & Plan   Assessment/Plan:    Problem List Items Addressed This Visit   None       Assessment and Plan               There are no discontinued medications.  No follow-ups on file.        Subjective:   Encounter date: 10/09/2024  Gabriel Higgins is a 84 y.o. male who has (HFpEF) heart failure with preserved ejection fraction (HCC); Aortic valve stenosis; Paroxysmal atrial fibrillation (HCC); Benign essential tremor; Chronic kidney disease (CKD), stage III (moderate) (HCC); Benign prostatic hyperplasia; Diabetes mellitus with stage 3 chronic kidney disease, without long-term current use of insulin (HCC); Early stage nonexudative age-related macular degeneration of both eyes; Essential hypertension; Gastroesophageal reflux disease without esophagitis; Hyperphosphatemia; Hypogonadism male; Impaired functional mobility, balance, gait, and endurance; Iron deficiency anemia due to chronic blood loss; 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   He  has a past medical history of Hyperlipidemia and Hypertension.[name]   He presents with chief complaint of No chief complaint on file. .   Discussed the use of AI scribe software for clinical note transcription with the patient, who gave verbal consent to proceed.  History of Present Illness          GERD - Experiencing reflux causing discomfort in his chest, affecting his ability to eat.  - He is sure that his symptoms are related to acid reflux. Does have history of  heart failure, aortic valve stenosis, paroxysmal atrial fibrillation, essential hypertension, COPD, and pulmonary HTN. - Expressed interest in Prevacid.    ROS  Past Surgical History:  Procedure Laterality Date   APPENDECTOMY     BACK SURGERY     CHOLECYSTECTOMY     REPLACEMENT TOTAL KNEE Bilateral    SHOULDER SURGERY      Medications Ordered Prior to Encounter[1]  No family history on file.  Social History   Socioeconomic History   Marital status: Married    Spouse name: Not on file   Number of children: Not on file   Years of education: Not on file   Highest education level: Not on file  Occupational History   Not on file  Tobacco Use   Smoking status: Never   Smokeless tobacco: Not on file  Vaping Use   Vaping status: Never Used  Substance and Sexual Activity   Alcohol use: No   Drug use: No   Sexual activity: Not Currently    Birth control/protection: None  Other Topics Concern   Not on file  Social History Narrative   Not on file   Social Drivers of Health   Tobacco Use: Unknown (09/05/2024)   Patient History    Smoking Tobacco Use: Never    Smokeless Tobacco Use: Unknown    Passive Exposure: Not on file  Recent Concern: Tobacco Use - Medium Risk (08/14/2024)   Received from [facility]   Patient History    Smoking Tobacco Use: Former    Smokeless Tobacco Use: Never    Passive Exposure: Past  Physicist, Medical Strain: Not on file  Food Insecurity: Low Risk (07/26/2024)   Received from [facility]   Epic    Within the past 12 months, you worried that your food would run out before you got money to buy more: Never true    Within the past 12 months, the food you bought just didn't last and you didn't have money to get more. : Never true  Transportation Needs: No Transportation Needs (02/19/2023)   Received from [facility]    In the past 12 months, has lack of reliable transportation kept you from medical appointments, meetings,  work or from getting things needed for daily living? : No  Physical Activity: Not on file  Stress: Not on file  Social Connections: Unknown (02/01/2022)   Received from [facility]   Social Network    Social Network: Not on file  Intimate Partner Violence: Low Risk  (11/03/2022)   Received from [facility] visits prior to 11/19/2022.   Safety    How often does anyone, including family and friends, physically hurt you?: Never    How often does anyone, including family and friends, insult or talk down to you?: Never    How often does anyone, including family and friends, threaten you with harm?: Never    How often does anyone, including family and friends, scream or curse at you?: Never  Depression (PHQ2-9): Low Risk (07/30/2024)   Depression (PHQ2-9)    PHQ-2 Score: 0  Alcohol Screen: Not on file  Housing: Low Risk (07/26/2024)   Received from [facility]   Epic    What is your living situation today?: I have a steady place to live    Think about the place you live. Do you have problems with any of the following? Choose all that apply:: Not on file  Utilities: Low Risk (02/19/2023)   Received from [facility]   Utilities    In the past 12 months has the electric, gas, oil, or water company threatened to shut off services in your home? : No  Health Literacy: Not on file                                                                                                  Objective:  Physical Exam: There were no vitals taken for this visit.   Physical Exam           Physical Exam  DG Chest 2 View Result Date: 08/22/2024 CLINICAL DATA:  Cough EXAM: CHEST - 2 VIEW COMPARISON:  Chest x-ray performed August 18, 2023 FINDINGS: Heart mediastinum are not significantly changed. No focal consolidation, pleural effusion, or pneumothorax. Degenerative changes in the thoracic spine. IMPRESSION: 1. No focal consolidation. 2. No significant interval change. Electronically  Signed   By: Maude Naegeli M.D.   On: 08/22/2024 15:26    Recent Results (from the past 2160 hours)  HM DIABETES EYE EXAM     Status: None   Collection Time: 08/14/24 12:00 AM  Result Value Ref Range   HM Diabetic Eye Exam No Retinopathy No Retinopathy  HM DIABETES EYE EXAM     Status: None  Collection Time: 08/14/24 12:00 AM  Result Value Ref Range   HM Diabetic Eye Exam No Retinopathy No Retinopathy  HM DIABETES EYE EXAM     Status: None   Collection Time: 09/03/24 12:00 AM  Result Value Ref Range   HM Diabetic Eye Exam No Retinopathy No Retinopathy  POCT HgB A1C     Status: Abnormal   Collection Time: 09/23/24  4:38 PM  Result Value Ref Range   Hemoglobin A1C 6.2 (A) 4.0 - 5.6 %   HbA1c POC (<> result, manual entry)     HbA1c, POC (prediabetic range)     HbA1c, POC (controlled diabetic range)          Beverley KATHEE Hummer, MD  I,Emily Lagle,acting as a scribe for Beverley KATHEE Hummer, MD.,have documented all relevant documentation on the behalf of Beverley KATHEE Hummer, MD.  LILLETTE Beverley KATHEE Hummer, MD, have reviewed all documentation for this visit. The documentation on 10/09/2024 for the exam, diagnosis, procedures, and orders are all accurate and complete.    [1]  Current Outpatient Medications on File Prior to Visit  Medication Sig Dispense Refill   amiodarone (PACERONE) 200 MG tablet Take 200 mg by mouth daily.     aspirin 81 MG tablet Take 81 mg by mouth daily.     budesonide -formoterol  (SYMBICORT ) 160-4.5 MCG/ACT inhaler Inhale 2 puffs into the lungs 2 (two) times daily. 1 each 12   bumetanide (BUMEX) 1 MG tablet Take 1 mg by mouth.     cyanocobalamin 100 MCG tablet Take 100 mcg by mouth.     diltiazem (CARDIZEM CD) 180 MG 24 hr capsule Take 180 mg by mouth.     hydrALAZINE (APRESOLINE) 100 MG tablet Take 100 mg by mouth 3 (three) times daily.     losartan (COZAAR) 100 MG tablet Take 100 mg by mouth daily.     Multiple Vitamin (MULTIVITAMIN) capsule Take 1 capsule by mouth daily.      propranolol  ER (INDERAL  LA) 80 MG 24 hr capsule Take 1 capsule (80 mg total) by mouth daily. 90 capsule 3   Semaglutide ,0.25 or 0.5MG /DOS, (OZEMPIC , 0.25 OR 0.5 MG/DOSE,) 2 MG/3ML SOPN Inject 0.25 mg into the skin once a week. 3 mL 1   traZODone (DESYREL) 50 MG tablet Take 50 mg by mouth at bedtime.     No current facility-administered medications on file prior to visit.   "

## 2024-10-09 NOTE — Telephone Encounter (Signed)
 ATC patient x3, the line would be picked up and then disconnect abruptly.  I left a detailed message per Dr. Leatha note.  I also sent him a mychart message and advised him to return our call or respond to the mychart message to let us  know he received the message and had no further questions.

## 2024-10-10 ENCOUNTER — Ambulatory Visit (INDEPENDENT_AMBULATORY_CARE_PROVIDER_SITE_OTHER): Admitting: Family Medicine

## 2024-10-10 ENCOUNTER — Encounter: Payer: Self-pay | Admitting: Family Medicine

## 2024-10-10 VITALS — BP 136/80 | HR 57 | Temp 98.0°F | Ht 70.0 in

## 2024-10-10 DIAGNOSIS — R079 Chest pain, unspecified: Secondary | ICD-10-CM | POA: Diagnosis not present

## 2024-10-10 DIAGNOSIS — Z638 Other specified problems related to primary support group: Secondary | ICD-10-CM

## 2024-10-10 NOTE — Progress Notes (Signed)
 "  Established Patient Office Visit   Subjective:  Patient ID: Gabriel Higgins, male    DOB: May 26, 1941  Age: 84 y.o. MRN: [id number]  Chief Complaint  Patient presents with   Acute Visit    GERD     HPI Encounter Diagnoses  Name Primary?   Chest pain, unspecified type Yes   Stress due to family tension    Health History Presents today asking for something for indigestion.  He has had 3 episodes of midsternal chest pain associated with severe nausea and near vomiting.  He denies shortness of breath or diaphoresis.  He does not remember the length of the episodes but they were brief.  He has no history of reflux.  He does admit to some increased flatulence and burping.  He is under significant stress.  He is separated recently from his wife of 30 years. Review of Systems  Constitutional: Negative.  Negative for diaphoresis.  HENT: Negative.    Eyes:  Negative for blurred vision, discharge and redness.  Respiratory: Negative.  Negative for shortness of breath.   Cardiovascular:  Positive for chest pain.  Gastrointestinal:  Positive for heartburn (???) and nausea. Negative for abdominal pain.  Genitourinary: Negative.   Musculoskeletal: Negative.  Negative for myalgias.  Skin:  Negative for rash.  Neurological:  Negative for tingling, loss of consciousness and weakness.  Endo/Heme/Allergies:  Negative for polydipsia.    Current Medications[1]   Objective:     BP 136/80   Pulse (!) 57   Temp 98 F (36.7 C)   Ht 5' 10 (1.778 m)   SpO2 95%   BMI 37.16 kg/m    Physical Exam Constitutional:      General: He is not in acute distress.    Appearance: Normal appearance. He is not ill-appearing, toxic-appearing or diaphoretic.  HENT:     Head: Normocephalic and atraumatic.     Right Ear: External ear normal.     Left Ear: External ear normal.     Mouth/Throat:     Mouth: Mucous membranes are moist.     Pharynx: Oropharynx is clear. No oropharyngeal exudate or posterior  oropharyngeal erythema.  Eyes:     General: No scleral icterus.       Right eye: No discharge.        Left eye: No discharge.     Extraocular Movements: Extraocular movements intact.     Conjunctiva/sclera: Conjunctivae normal.     Pupils: Pupils are equal, round, and reactive to light.  Cardiovascular:     Rate and Rhythm: Normal rate and regular rhythm.  Pulmonary:     Effort: Pulmonary effort is normal. No respiratory distress.     Breath sounds: Normal breath sounds. No wheezing or rales.  Abdominal:     General: Bowel sounds are normal.     Tenderness: There is no abdominal tenderness. There is no guarding.  Musculoskeletal:     Cervical back: No rigidity or tenderness.  Skin:    General: Skin is warm and dry.  Neurological:     Mental Status: He is alert and oriented to person, place, and time.  Psychiatric:        Mood and Affect: Mood normal.        Behavior: Behavior normal.      No results found for any visits on 10/10/24.    The ASCVD Risk score (Arnett DK, et al., 2019) failed to calculate for the following reasons:   The 2019 ASCVD risk score  is only valid for ages 46 to 35   * - Cholesterol units were assumed    Assessment & Plan:   Chest pain, unspecified type  Stress due to family tension    Return if symptoms worsen or fail to improve.  Patient declined the EKG because he felt as though he did not have the balance to lay flat on our exam table.  I recommended transport to the hospital and he refused.  He just said that he wanted to leave.  He tells me that he will not go to the hospital.  He understands that he was leaving AGAINST MEDICAL ADVICE and thanked me.    Elsie Sim Lent, MD    [1]  Current Outpatient Medications:    amiodarone (PACERONE) 200 MG tablet, Take 200 mg by mouth daily., Disp: , Rfl:    aspirin 81 MG tablet, Take 81 mg by mouth daily., Disp: , Rfl:    bumetanide (BUMEX) 1 MG tablet, Take 1 mg by mouth. (Patient taking  differently: Take 1 mg by mouth. Every Monday, Wednesday, and Friday), Disp: , Rfl:    cyanocobalamin 100 MCG tablet, Take 100 mcg by mouth., Disp: , Rfl:    diltiazem (CARDIZEM CD) 180 MG 24 hr capsule, Take 180 mg by mouth., Disp: , Rfl:    famotidine  (PEPCID ) 20 MG tablet, Take 1 tablet (20 mg total) by mouth 2 (two) times daily., Disp: 60 tablet, Rfl: 0   fluticasone -salmeterol (WIXELA INHUB) 250-50 MCG/ACT AEPB, Inhale 1 puff into the lungs in the morning and at bedtime., Disp: , Rfl:    hydrALAZINE (APRESOLINE) 100 MG tablet, Take 100 mg by mouth 3 (three) times daily., Disp: , Rfl:    losartan (COZAAR) 100 MG tablet, Take 100 mg by mouth daily., Disp: , Rfl:    Multiple Vitamin (MULTIVITAMIN) capsule, Take 1 capsule by mouth daily., Disp: , Rfl:    propranolol  (INDERAL ) 10 MG tablet, Take 20 mg by mouth 3 (three) times daily., Disp: , Rfl:    propranolol  ER (INDERAL  LA) 80 MG 24 hr capsule, Take 1 capsule (80 mg total) by mouth daily., Disp: 90 capsule, Rfl: 3   traZODone (DESYREL) 50 MG tablet, Take 50 mg by mouth at bedtime., Disp: , Rfl:    Semaglutide ,0.25 or 0.5MG /DOS, (OZEMPIC , 0.25 OR 0.5 MG/DOSE,) 2 MG/3ML SOPN, Inject 0.25 mg into the skin once a week. (Patient not taking: Reported on 10/10/2024), Disp: 3 mL, Rfl: 1  "

## 2024-10-10 NOTE — Telephone Encounter (Signed)
 Informed patient his medication has been sent into his pharmacy

## 2024-10-14 ENCOUNTER — Other Ambulatory Visit: Payer: Self-pay | Admitting: Licensed Clinical Social Worker

## 2024-10-14 NOTE — Patient Instructions (Signed)
 Visit Information  Thank you for taking time to visit with me today. Please don't hesitate to contact me if I can be of assistance to you before our next scheduled appointment.  Our next appointment is by telephone on 10/31/24 at 11:30pm. Please call the care guide team at [phone] if you need to cancel or reschedule your appointment.   Following is a copy of your care plan:   Goals Addressed   None     Please call 911 if you are experiencing a Mental Health or Behavioral Health Crisis or need someone to talk to.  Patient verbalized understanding of Care plan and visit instructions communicated this visit  Cena Ligas, LCSW Clinical Social Worker [facility] [employer]

## 2024-10-14 NOTE — Patient Outreach (Signed)
 LCSW spoke to patient briefly on the phone. LCSW inquired if he made contact with [facility] to begin therapy. Patient stated no. LCSW called [facility], they confirmed they reached out once by email (patients preference)  and once by phone. LCSW called patient 2x to inform him to check his email but there was no answer either time.   Gabriel Ligas, LCSW Clinical Social Worker [facility]

## 2024-10-22 ENCOUNTER — Telehealth: Payer: Self-pay | Admitting: Family Medicine

## 2024-10-22 ENCOUNTER — Telehealth: Payer: Self-pay

## 2024-10-22 NOTE — Telephone Encounter (Signed)
 Can patient take prilosec after completing his course for pepcid ?

## 2024-10-23 NOTE — Telephone Encounter (Signed)
 Pt states he is wanting a rx for the prilosec

## 2024-10-23 NOTE — Telephone Encounter (Signed)
 Pt is scheduled for 10/24/2024 at 2:20pm

## 2024-10-24 ENCOUNTER — Ambulatory Visit: Admitting: Family Medicine

## 2024-10-24 VITALS — BP 194/95 | HR 60 | Temp 97.8°F | Ht 70.0 in | Wt 257.0 lb

## 2024-10-24 DIAGNOSIS — I480 Paroxysmal atrial fibrillation: Secondary | ICD-10-CM

## 2024-10-24 DIAGNOSIS — I5032 Chronic diastolic (congestive) heart failure: Secondary | ICD-10-CM

## 2024-10-24 DIAGNOSIS — I10 Essential (primary) hypertension: Secondary | ICD-10-CM

## 2024-10-24 DIAGNOSIS — N1831 Chronic kidney disease, stage 3a: Secondary | ICD-10-CM

## 2024-10-24 DIAGNOSIS — E559 Vitamin D deficiency, unspecified: Secondary | ICD-10-CM

## 2024-10-24 DIAGNOSIS — G4733 Obstructive sleep apnea (adult) (pediatric): Secondary | ICD-10-CM

## 2024-10-24 DIAGNOSIS — K219 Gastro-esophageal reflux disease without esophagitis: Secondary | ICD-10-CM

## 2024-10-24 DIAGNOSIS — Z8679 Personal history of other diseases of the circulatory system: Secondary | ICD-10-CM

## 2024-10-24 DIAGNOSIS — E782 Mixed hyperlipidemia: Secondary | ICD-10-CM

## 2024-10-24 MED ORDER — INDAPAMIDE 1.25 MG PO TABS: 1.2500 mg | ORAL_TABLET | Freq: Every day | ORAL | 0 refills | Status: AC

## 2024-10-24 MED ORDER — ESOMEPRAZOLE MAGNESIUM 40 MG PO CPDR: 40.0000 mg | DELAYED_RELEASE_CAPSULE | Freq: Every day | ORAL | 3 refills | Status: AC

## 2024-10-24 NOTE — Progress Notes (Signed)
 " Assessment & Plan   Assessment/Plan:     Assessment & Plan Mixed hyperlipidemia Requires monitoring to assess renal function and potential need for statin adjustment. - Ordered lipid panel - Ordered LFTs with complete metabolic panel  Type 2 diabetes mellitus with stage 3a chronic kidney disease, without long-term current use of insulin (HCC) Requires monitoring of renal function and electrolytes. - Ordered complete metabolic panel with GFR - Ordered urinalysis with microscopic plus reflex culture - Ordered microalbumin creatinine ratio - Ordered PTH intact - Ordered phosphorus - Ordered calcium  Vitamin D deficiency Requires monitoring. - Ordered vitamin D level  Obstructive sleep apnea syndrome Requires screening for polycythemia. - Ordered CBC  Essential hypertension Hypertension is elevated at 190/91 mmHg. Home readings are 160-170/68-70 mmHg. Stress is a contributing factor. Current medications include propranolol , losartan, hydralazine, and Bumex. Indapamide  is recommended to further control blood pressure. [facility] is advised due to resistant hypertension and high risk factors. - Started indapamide  1.25 mg daily - Ordered lab work today - Will repeat metabolic panel in one week to assess electrolytes and renal function - Continue to check blood pressure at home - Will follow up in one month for blood pressure check - [facility]  Chronic heart failure with preserved ejection fraction (HFpEF) (HCC) Requires monitoring of renal function and electrolytes. - Ordered complete metabolic panel with GFR - Ordered urinalysis with microscopic plus reflex culture  Paroxysmal atrial fibrillation (HCC) Requires monitoring of magnesium  levels. - Ordered magnesium  level  S/P ablation of atrial fibrillation Requires monitoring of magnesium  levels. - Ordered magnesium  level  Gastroesophageal reflux disease,  unspecified whether esophagitis present GERD symptoms improved with over-the-counter omeprazole. Esomeprazole  is recommended for better efficacy. - Recommended esomeprazole  40 mg once daily, 30-60 minutes before breakfast       Medications Discontinued During This Encounter  Medication Reason   propranolol  (INDERAL ) 10 MG tablet    famotidine  (PEPCID ) 20 MG tablet    Semaglutide ,0.25 or 0.5MG /DOS, (OZEMPIC , 0.25 OR 0.5 MG/DOSE,) 2 MG/3ML SOPN     Return in about 1 month (around 11/21/2024) for BP.        Subjective:   Encounter date: 10/24/2024  Gabriel Higgins is a 84 y.o. male who has (HFpEF) heart failure with preserved ejection fraction (HCC); Aortic valve stenosis; Paroxysmal atrial fibrillation (HCC); Benign essential tremor; Chronic kidney disease (CKD), stage III (moderate) (HCC); Benign prostatic hyperplasia; Diabetes mellitus with stage 3 chronic kidney disease, without long-term current use of insulin (HCC); Early stage nonexudative age-related macular degeneration of both eyes; Essential hypertension; Gastroesophageal reflux disease; Hyperphosphatemia; Hypogonadism male; Impaired functional mobility, balance, gait, and endurance; Iron deficiency anemia due to chronic blood loss; 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   He  has a past medical history of Hyperlipidemia and Hypertension.[name]   He presents with chief complaint of Medication Refill (Pt wants to get the Rx for prilosec ) .   Discussed the use of AI scribe  software for clinical note transcription with the patient, who gave verbal consent to proceed.  History of Present Illness Gabriel Higgins is an 84 year old male with hypertension and GERD  who presents with heartburn.  Heartburn and gastroesophageal reflux symptoms - Heartburn occurs particularly when eating too quickly - Associated with chest pain attributed to acid reflux - Famotidine  20 mg twice daily was ineffective due to timing with meals - Started over-the-counter omeprazole recently, resulting in improved symptoms - No chest pain related to cardiac etiology; all chest pain attributed to heartburn  Hypertension - Home blood pressure readings typically 160-170 mmHg systolic and 68-70 mmHg diastolic - Blood pressure during visit was 190/91 mmHg - Attributes elevated blood pressure to stress, particularly from previous employment at [employer] - Current antihypertensive regimen includes propranolol  80 mg daily, losartan 100 mg daily, hydralazine 100 mg three times daily, and Bumex 1 mg on Monday, Wednesday, and Friday - Blood pressure remains elevated despite current medications  Chronic heart failure with preserved ejection fraction - History of chronic heart failure with preserved ejection fraction  Type 2 diabetes mellitus with chronic kidney disease - Type 2 diabetes mellitus with stage 3A chronic kidney disease - Recent weight loss of 50 pounds - Unable to start previously recommended injection due to cost of $795 per month  Paroxysmal atrial fibrillation - Paroxysmal atrial fibrillation - Current medications include amiodarone 200 mg daily for rhythm control and diltiazem 100 mg daily for rate control  Obstructive sleep apnea syndrome - Obstructive sleep apnea syndrome  Mixed hyperlipidemia - Mixed hyperlipidemia  Vitamin d deficiency - History of vitamin D deficiency     ROS  Past Surgical History:  Procedure Laterality Date   APPENDECTOMY      BACK SURGERY     CHOLECYSTECTOMY     REPLACEMENT TOTAL KNEE Bilateral    SHOULDER SURGERY      Outpatient Medications Prior to Visit  Medication Sig Dispense Refill   amiodarone (PACERONE) 200 MG tablet Take 200 mg by mouth daily.     aspirin 81 MG tablet Take 81 mg by mouth daily.     bumetanide (BUMEX) 1 MG tablet Take 1 mg by mouth. (Patient taking differently: Take 1 mg by mouth. Every Monday, Wednesday, and Friday)     cyanocobalamin 100 MCG tablet Take 100 mcg by mouth.     diltiazem (CARDIZEM CD) 180 MG 24 hr capsule Take 180 mg by mouth.     fluticasone -salmeterol (WIXELA INHUB) 250-50 MCG/ACT AEPB Inhale 1 puff into the lungs in the morning and at bedtime.     hydrALAZINE (APRESOLINE) 100 MG tablet Take 100 mg by mouth 3 (three) times daily.     losartan (COZAAR) 100 MG tablet Take 100 mg by mouth daily.     Multiple Vitamin (MULTIVITAMIN) capsule Take 1 capsule by mouth daily.     propranolol  ER (INDERAL  LA) 80 MG 24 hr capsule Take 1 capsule (80 mg total) by mouth daily. 90 capsule 3   traZODone (DESYREL) 50 MG tablet Take 50 mg by mouth at bedtime.     famotidine  (PEPCID ) 20 MG tablet Take 1 tablet (20 mg total) by mouth 2 (two) times daily. 60 tablet 0   propranolol  (INDERAL ) 10 MG tablet Take 20 mg by mouth 3 (three) times daily.     Semaglutide ,0.25 or 0.5MG /DOS, (OZEMPIC , 0.25 OR 0.5 MG/DOSE,) 2 MG/3ML SOPN Inject 0.25 mg into the skin once a week. 3 mL 1   No facility-administered medications prior to visit.    No family history on file.  Social History   Socioeconomic History   Marital status: Married    Spouse name: Not  on file   Number of children: Not on file   Years of education: Not on file   Highest education level: Not on file  Occupational History   Not on file  Tobacco Use   Smoking status: Never   Smokeless tobacco: Not on file  Vaping Use   Vaping status: Never Used  Substance and Sexual Activity   Alcohol use: No   Drug use: No   Sexual  activity: Not Currently    Birth control/protection: None  Other Topics Concern   Not on file  Social History Narrative   Not on file   Social Drivers of Health   Tobacco Use: Unknown (10/10/2024)   Patient History    Smoking Tobacco Use: Never    Smokeless Tobacco Use: Unknown    Passive Exposure: Not on file  Recent Concern: Tobacco Use - Medium Risk (08/14/2024)   Received from [facility]   Patient History    Smoking Tobacco Use: Former    Smokeless Tobacco Use: Never    Passive Exposure: Past  Physicist, Medical Strain: Not on file  Food Insecurity: Low Risk (07/26/2024)   Received from [facility]   Epic    Within the past 12 months, you worried that your food would run out before you got money to buy more: Never true    Within the past 12 months, the food you bought just didn't last and you didn't have money to get more. : Never true  Transportation Needs: No Transportation Needs (02/19/2023)   Received from [facility]    In the past 12 months, has lack of reliable transportation kept you from medical appointments, meetings, work or from getting things needed for daily living? : No  Physical Activity: Not on file  Stress: Not on file  Social Connections: Unknown (02/01/2022)   Received from [facility]   Social Network    Social Network: Not on file  Intimate Partner Violence: Low Risk  (11/03/2022)   Received from [facility] visits prior to 11/19/2022.   Safety    How often does anyone, including family and friends, physically hurt you?: Never    How often does anyone, including family and friends, insult or talk down to you?: Never    How often does anyone, including family and friends, threaten you with harm?: Never    How often does anyone, including family and friends, scream or curse at you?: Never  Depression (PHQ2-9): Low Risk (10/24/2024)   Depression (PHQ2-9)    PHQ-2 Score: 0  Alcohol Screen: Not on file  Housing:  Low Risk (07/26/2024)   Received from [facility]   Epic    What is your living situation today?: I have a steady place to live    Think about the place you live. Do you have problems with any of the following? Choose all that apply:: Not on file  Utilities: Low Risk (02/19/2023)   Received from [facility]   Utilities    In the past 12 months has the electric, gas, oil, or water company threatened to shut off services in your home? : No  Health Literacy: Not on file  Objective:  Physical Exam: BP (!) 194/95   Pulse 60   Temp 97.8 F (36.6 C)   Ht 5' 10 (1.778 m)   Wt 257 lb (116.6 kg)   SpO2 95%   BMI 36.88 kg/m    Physical Exam VITALS: P- 60, BP- 190/91 GENERAL: Alert, cooperative, well developed, no acute distress HEENT: Normocephalic, normal oropharynx, moist mucous membranes CHEST: Clear to auscultation bilaterally, no wheezes, rhonchi, or crackles CARDIOVASCULAR: Normal heart rate and rhythm, S1 and S2 normal without murmurs ABDOMEN: Soft, non-tender, non-distended, without organomegaly, normal bowel sounds EXTREMITIES: No cyanosis or edema NEUROLOGICAL: Cranial nerves grossly intact, moves all extremities without gross motor or sensory deficit   Physical Exam  DG Chest 2 View Result Date: 08/22/2024 CLINICAL DATA:  Cough EXAM: CHEST - 2 VIEW COMPARISON:  Chest x-ray performed August 18, 2023 FINDINGS: Heart mediastinum are not significantly changed. No focal consolidation, pleural effusion, or pneumothorax. Degenerative changes in the thoracic spine. IMPRESSION: 1. No focal consolidation. 2. No significant interval change. Electronically Signed   By: Maude Naegeli M.D.   On: 08/22/2024 15:26    Recent Results (from the past 2160 hours)  HM DIABETES EYE EXAM     Status: None   Collection Time: 08/14/24 12:00 AM  Result Value Ref Range   HM Diabetic Eye Exam No  Retinopathy No Retinopathy  HM DIABETES EYE EXAM     Status: None   Collection Time: 08/14/24 12:00 AM  Result Value Ref Range   HM Diabetic Eye Exam No Retinopathy No Retinopathy  HM DIABETES EYE EXAM     Status: None   Collection Time: 09/03/24 12:00 AM  Result Value Ref Range   HM Diabetic Eye Exam No Retinopathy No Retinopathy  POCT HgB A1C     Status: Abnormal   Collection Time: 09/23/24  4:38 PM  Result Value Ref Range   Hemoglobin A1C 6.2 (A) 4.0 - 5.6 %   HbA1c POC (<> result, manual entry)     HbA1c, POC (prediabetic range)     HbA1c, POC (controlled diabetic range)          Beverley Adine Hummer, MD, MS  I,Emily Lagle,acting as a scribe for Beverley KATHEE Hummer, MD.,have documented all relevant documentation on the behalf of Beverley KATHEE Hummer, MD.  LILLETTE Beverley KATHEE Hummer, MD, have reviewed all documentation for this visit. The documentation on 10/24/2024 for the exam, diagnosis, procedures, and orders are all accurate and complete.  "

## 2024-10-24 NOTE — Patient Instructions (Addendum)
 It was very nice to see you today!  VISIT SUMMARY: During your visit, we addressed your heartburn, hypertension, chronic heart failure, type 2 diabetes with chronic kidney disease, paroxysmal atrial fibrillation, obstructive sleep apnea, mixed hyperlipidemia, and vitamin D deficiency. We made adjustments to your medications and ordered several tests to monitor your conditions.  YOUR PLAN: HEARTBURN AND GASTROESOPHAGEAL REFLUX SYMPTOMS: Your heartburn symptoms have improved with over-the-counter omeprazole. -Switch to esomeprazole  40 mg once daily, 30-60 minutes before breakfast.  HYPERTENSION: Your blood pressure remains elevated despite current medications. -Start indapamide  1.25 mg daily. -Continue to check your blood pressure at home. -We will repeat your metabolic panel in one week to assess electrolytes and renal function. -Follow up in one month for a blood pressure check. -Referral to [facility] for further management.  CHRONIC HEART FAILURE WITH PRESERVED EJECTION FRACTION: We need to monitor your renal function and electrolytes. -Ordered complete metabolic panel with GFR. -Ordered urinalysis with microscopic plus reflex culture.  TYPE 2 DIABETES MELLITUS WITH STAGE 3A CHRONIC KIDNEY DISEASE: We need to monitor your renal function and electrolytes. -Ordered complete metabolic panel with GFR. -Ordered urinalysis with microscopic plus reflex culture. -Ordered microalbumin creatinine ratio. -Ordered PTH intact. -Ordered phosphorus. -Ordered calcium.  PAROXYSMAL ATRIAL FIBRILLATION: We need to monitor your magnesium  levels. -Ordered magnesium  level.  OBSTRUCTIVE SLEEP APNEA SYNDROME: We need to screen for polycythemia. -Ordered CBC.  MIXED HYPERLIPIDEMIA: We need to monitor your renal function and potential need for statin adjustment. -Ordered lipid panel. -Ordered LFTs with complete metabolic panel.  VITAMIN D DEFICIENCY: We need to monitor your vitamin D  levels. -Ordered vitamin D level.  Return in about 1 month (around 11/21/2024) for BP.   Take care, Arvella Hummer, MD, MS   PLEASE NOTE:  If you had any lab tests, please let us  know if you have not heard back within a few days. You may see your results on mychart before we have a chance to review them but we will give you a call once they are reviewed by us .   If we ordered any referrals today, please let us  know if you have not heard from their office within the next week.   If you had any urgent prescriptions sent in today, please check with the pharmacy within an hour of our visit to make sure the prescription was transmitted appropriately.   Please try these tips to maintain a healthy lifestyle:  Eat at least 3 REAL meals and 1-2 snacks per day.  Aim for no more than 5 hours between eating.  If you eat breakfast, please do so within one hour of getting up.   Each meal should contain half fruits/vegetables, one quarter protein, and one quarter carbs (no bigger than a computer mouse)  Cut down on sweet beverages. This includes juice, soda, and sweet tea.   Drink at least 1 glass of water with each meal and aim for at least 8 glasses per day  Exercise at least 150 minutes every week.

## 2024-10-25 LAB — NO CULTURE INDICATED

## 2024-10-25 LAB — URINALYSIS W MICROSCOPIC + REFLEX CULTURE
Bacteria, UA: NONE SEEN /HPF
Bilirubin Urine: NEGATIVE
Glucose, UA: NEGATIVE
Hgb urine dipstick: NEGATIVE
Ketones, ur: NEGATIVE
Leukocyte Esterase: NEGATIVE
Nitrites, Initial: NEGATIVE
RBC / HPF: NONE SEEN /HPF (ref 0–2)
Specific Gravity, Urine: 1.02 (ref 1.001–1.035)
Squamous Epithelial / HPF: NONE SEEN /HPF
WBC, UA: NONE SEEN /HPF (ref 0–5)
pH: 5.5 (ref 5.0–8.0)

## 2024-10-25 LAB — CBC WITH DIFFERENTIAL/PLATELET
Basophils Absolute: 0.1 10*3/uL (ref 0.0–0.1)
Basophils Relative: 1.2 % (ref 0.0–3.0)
Eosinophils Absolute: 0.7 10*3/uL (ref 0.0–0.7)
Eosinophils Relative: 6.7 % — ABNORMAL HIGH (ref 0.0–5.0)
HCT: 47.6 % (ref 39.0–52.0)
Hemoglobin: 16.1 g/dL (ref 13.0–17.0)
Lymphocytes Relative: 12.5 % (ref 12.0–46.0)
Lymphs Abs: 1.3 10*3/uL (ref 0.7–4.0)
MCHC: 33.8 g/dL (ref 30.0–36.0)
MCV: 93.5 fl (ref 78.0–100.0)
Monocytes Absolute: 1.2 10*3/uL — ABNORMAL HIGH (ref 0.1–1.0)
Monocytes Relative: 12 % (ref 3.0–12.0)
Neutro Abs: 6.8 10*3/uL (ref 1.4–7.7)
Neutrophils Relative %: 67.6 % (ref 43.0–77.0)
Platelets: 202 10*3/uL (ref 150.0–400.0)
RBC: 5.09 Mil/uL (ref 4.22–5.81)
RDW: 14.4 % (ref 11.5–15.5)
WBC: 10 10*3/uL (ref 4.0–10.5)

## 2024-10-25 LAB — COMPREHENSIVE METABOLIC PANEL WITH GFR
ALT: 27 U/L (ref 3–53)
AST: 22 U/L (ref 5–37)
Albumin: 4 g/dL (ref 3.5–5.2)
Alkaline Phosphatase: 93 U/L (ref 39–117)
BUN: 32 mg/dL — ABNORMAL HIGH (ref 6–23)
CO2: 26 meq/L (ref 19–32)
Calcium: 9.4 mg/dL (ref 8.4–10.5)
Chloride: 101 meq/L (ref 96–112)
Creatinine, Ser: 1.73 mg/dL — ABNORMAL HIGH (ref 0.40–1.50)
GFR: 35.71 mL/min — ABNORMAL LOW
Glucose, Bld: 111 mg/dL — ABNORMAL HIGH (ref 70–99)
Potassium: 3.9 meq/L (ref 3.5–5.1)
Sodium: 138 meq/L (ref 135–145)
Total Bilirubin: 0.8 mg/dL (ref 0.2–1.2)
Total Protein: 6.9 g/dL (ref 6.0–8.3)

## 2024-10-25 LAB — LIPID PANEL
Cholesterol: 170 mg/dL (ref 28–200)
HDL: 46.1 mg/dL
LDL Cholesterol: 85 mg/dL (ref 10–99)
NonHDL: 124.23
Total CHOL/HDL Ratio: 4
Triglycerides: 198 mg/dL — ABNORMAL HIGH (ref 10.0–149.0)
VLDL: 39.6 mg/dL (ref 0.0–40.0)

## 2024-10-25 LAB — MICROALBUMIN / CREATININE URINE RATIO
Creatinine,U: 110.6 mg/dL
Microalb Creat Ratio: 2202.6 mg/g — ABNORMAL HIGH (ref 0.0–30.0)
Microalb, Ur: 243.6 mg/dL — ABNORMAL HIGH (ref 0.7–1.9)

## 2024-10-25 LAB — PARATHYROID HORMONE, INTACT (NO CA): PTH: 40 pg/mL (ref 16–77)

## 2024-10-25 LAB — PHOSPHORUS: Phosphorus: 4 mg/dL (ref 2.3–4.6)

## 2024-10-25 LAB — MAGNESIUM: Magnesium: 2 mg/dL (ref 1.5–2.5)

## 2024-10-25 LAB — TSH RFX ON ABNORMAL TO FREE T4: TSH: 3.48 u[IU]/mL (ref 0.450–4.500)

## 2024-10-31 ENCOUNTER — Telehealth: Admitting: Licensed Clinical Social Worker

## 2024-12-05 ENCOUNTER — Encounter

## 2024-12-26 ENCOUNTER — Ambulatory Visit: Admitting: Family Medicine

## 2025-01-06 ENCOUNTER — Ambulatory Visit: Admitting: Pulmonary Disease
